# Patient Record
Sex: Male | Born: 1960
Health system: Southern US, Community
[De-identification: ages and names within clinical notes are randomized; demographics above are authoritative.]

## PROBLEM LIST (undated history)

## (undated) DIAGNOSIS — R7303 Prediabetes: Secondary | ICD-10-CM

## (undated) DIAGNOSIS — E669 Obesity, unspecified: Secondary | ICD-10-CM

## (undated) DIAGNOSIS — L0390 Cellulitis, unspecified: Secondary | ICD-10-CM

---

## 2004-06-05 ENCOUNTER — Emergency Department (HOSPITAL_COMMUNITY): Admission: EM | Admit: 2004-06-05 | Discharge: 2004-06-05 | Payer: Self-pay | Admitting: Emergency Medicine

## 2005-08-02 ENCOUNTER — Emergency Department (HOSPITAL_COMMUNITY): Admission: EM | Admit: 2005-08-02 | Discharge: 2005-08-02 | Payer: Self-pay | Admitting: *Deleted

## 2008-04-18 ENCOUNTER — Emergency Department (HOSPITAL_COMMUNITY): Admission: EM | Admit: 2008-04-18 | Discharge: 2008-04-18 | Payer: Self-pay | Admitting: Emergency Medicine

## 2014-04-24 ENCOUNTER — Emergency Department (HOSPITAL_COMMUNITY)
Admission: EM | Admit: 2014-04-24 | Discharge: 2014-04-24 | Disposition: A | Discharge status: Home / Self Care | Payer: No Typology Code available for payment source | Attending: Emergency Medicine | Admitting: Emergency Medicine

## 2014-04-24 ENCOUNTER — Emergency Department (HOSPITAL_COMMUNITY): Payer: No Typology Code available for payment source

## 2014-04-24 ENCOUNTER — Encounter (HOSPITAL_COMMUNITY): Payer: Self-pay | Admitting: Emergency Medicine

## 2014-04-24 DIAGNOSIS — S0993XA Unspecified injury of face, initial encounter: Secondary | ICD-10-CM | POA: Insufficient documentation

## 2014-04-24 DIAGNOSIS — S3981XA Other specified injuries of abdomen, initial encounter: Secondary | ICD-10-CM | POA: Insufficient documentation

## 2014-04-24 DIAGNOSIS — Y9389 Activity, other specified: Secondary | ICD-10-CM | POA: Insufficient documentation

## 2014-04-24 DIAGNOSIS — S4980XA Other specified injuries of shoulder and upper arm, unspecified arm, initial encounter: Secondary | ICD-10-CM | POA: Insufficient documentation

## 2014-04-24 DIAGNOSIS — S46909A Unspecified injury of unspecified muscle, fascia and tendon at shoulder and upper arm level, unspecified arm, initial encounter: Secondary | ICD-10-CM | POA: Insufficient documentation

## 2014-04-24 DIAGNOSIS — Y92410 Unspecified street and highway as the place of occurrence of the external cause: Secondary | ICD-10-CM | POA: Insufficient documentation

## 2014-04-24 DIAGNOSIS — S199XXA Unspecified injury of neck, initial encounter: Principal | ICD-10-CM

## 2014-04-24 DIAGNOSIS — Z79899 Other long term (current) drug therapy: Secondary | ICD-10-CM | POA: Insufficient documentation

## 2014-04-24 MED ORDER — HYDROCODONE-ACETAMINOPHEN 5-325 MG PO TABS: 2.0000 | ORAL_TABLET | Freq: Four times a day (QID) | ORAL | Status: DC | PRN

## 2014-04-24 MED ORDER — CYCLOBENZAPRINE HCL 10 MG PO TABS
5.0000 mg | ORAL_TABLET | Freq: Once | ORAL | Status: AC
  Administered 2014-04-24: 5 mg via ORAL
  Filled 2014-04-24: qty 1

## 2014-04-24 MED ORDER — CYCLOBENZAPRINE HCL 5 MG PO TABS: 5.0000 mg | ORAL_TABLET | Freq: Three times a day (TID) | ORAL | Status: DC | PRN

## 2014-04-24 MED ORDER — HYDROCODONE-ACETAMINOPHEN 5-325 MG PO TABS
2.0000 | ORAL_TABLET | Freq: Once | ORAL | Status: AC
  Administered 2014-04-24: 2 via ORAL
  Filled 2014-04-24: qty 2
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
## 2014-04-24 NOTE — ED Notes (Signed)
No visible injuries or brusing noted. Denies htiting his head, denies LOC.
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
## 2015-03-06 ENCOUNTER — Emergency Department (HOSPITAL_COMMUNITY)
Admission: EM | Admit: 2015-03-06 | Discharge: 2015-03-06 | Disposition: A | Discharge status: Home / Self Care | Payer: Non-veteran care | Attending: Emergency Medicine | Admitting: Emergency Medicine

## 2015-03-06 ENCOUNTER — Encounter (HOSPITAL_COMMUNITY): Payer: Self-pay | Admitting: *Deleted

## 2015-03-06 DIAGNOSIS — K088 Other specified disorders of teeth and supporting structures: Secondary | ICD-10-CM | POA: Diagnosis present

## 2015-03-06 DIAGNOSIS — E669 Obesity, unspecified: Secondary | ICD-10-CM | POA: Diagnosis not present

## 2015-03-06 DIAGNOSIS — A691 Other Vincent's infections: Secondary | ICD-10-CM

## 2015-03-06 HISTORY — DX: Obesity, unspecified: E66.9

## 2015-03-06 MED ORDER — CHLORHEXIDINE GLUCONATE 0.12 % MT SOLN: 15.0000 mL | Freq: Two times a day (BID) | OROMUCOSAL | Status: DC

## 2015-03-06 MED ORDER — PREDNISONE 20 MG PO TABS: 20.0000 mg | ORAL_TABLET | Freq: Two times a day (BID) | ORAL | Status: DC

## 2015-03-06 MED ORDER — PENICILLIN V POTASSIUM 250 MG PO TABS
500.0000 mg | ORAL_TABLET | Freq: Once | ORAL | Status: AC
Start: 2015-03-06 — End: 2015-03-06
  Administered 2015-03-06: 500 mg via ORAL
  Filled 2015-03-06: qty 2

## 2015-03-06 MED ORDER — PREDNISONE 20 MG PO TABS
60.0000 mg | ORAL_TABLET | Freq: Once | ORAL | Status: AC
  Administered 2015-03-06: 60 mg via ORAL
  Filled 2015-03-06: qty 3

## 2015-03-06 MED ORDER — HYDROCODONE-ACETAMINOPHEN 5-325 MG PO TABS: 1.0000 | ORAL_TABLET | ORAL | Status: DC | PRN

## 2015-03-06 MED ORDER — PENICILLIN V POTASSIUM 500 MG PO TABS: 500.0000 mg | ORAL_TABLET | Freq: Four times a day (QID) | ORAL | Status: DC

## 2015-03-06 MED ORDER — HYDROCODONE-ACETAMINOPHEN 5-325 MG PO TABS
1.0000 | ORAL_TABLET | Freq: Once | ORAL | Status: AC
  Administered 2015-03-06: 1 via ORAL
  Filled 2015-03-06: qty 1

## 2015-03-06 NOTE — ED Provider Notes (Signed)
CSN: [id number]     Arrival date & time 03/06/15  1324 History   First MD Initiated Contact with Patient 03/06/15 1604     Chief Complaint  Patient presents with  . Dental Pain  . Facial Swelling      HPI  Patient presents for valuation of mouth pain. He states the last 3 days he's had pain around his left maxillary central incisor. He noticed that his mouth would believe a brush his teeth. His lipids become swollen. He presents here.  Past Medical History  Diagnosis Date  . Obesity    History reviewed. No pertinent past surgical history. History reviewed. No pertinent family history. History  Substance Use Topics  . Smoking status: Never Smoker   . Smokeless tobacco: Not on file  . Alcohol Use: No    Review of Systems  Constitutional: Negative for fever, chills, diaphoresis, appetite change and fatigue.  HENT: Positive for mouth sores. Negative for sore throat and trouble swallowing.   Eyes: Negative for visual disturbance.  Respiratory: Negative for cough, chest tightness, shortness of breath and wheezing.   Cardiovascular: Negative for chest pain.  Gastrointestinal: Negative for nausea, vomiting, abdominal pain, diarrhea and abdominal distention.  Endocrine: Negative for polydipsia, polyphagia and polyuria.  Genitourinary: Negative for dysuria, frequency and hematuria.  Musculoskeletal: Negative for gait problem.  Skin: Negative for color change, pallor and rash.  Neurological: Negative for dizziness, syncope, light-headedness and headaches.  Hematological: Does not bruise/bleed easily.  Psychiatric/Behavioral: Negative for behavioral problems and confusion.      Allergies  Review of patient's allergies indicates no known allergies.  Home Medications   Prior to Admission medications   Medication Sig Start Date End Date Taking? Authorizing Provider  chlorhexidine (PERIDEX) 0.12 % solution Use as directed 15 mLs in the mouth or throat 2 (two) times daily. 03/06/15    Rolland Porter, MD  cyclobenzaprine (FLEXERIL) 5 MG tablet Take 1 tablet (5 mg total) by mouth 3 (three) times daily as needed for muscle spasms. Patient not taking: Reported on 03/06/2015 04/24/14   Marily Memos, MD  HYDROcodone-acetaminophen (NORCO/VICODIN) 5-325 MG per tablet Take 1 tablet by mouth every 4 (four) hours as needed. 03/06/15   Rolland Porter, MD  penicillin v potassium (VEETID) 500 MG tablet Take 1 tablet (500 mg total) by mouth 4 (four) times daily. 03/06/15   Rolland Porter, MD  predniSONE (DELTASONE) 20 MG tablet Take 1 tablet (20 mg total) by mouth 2 (two) times daily with a meal. 03/06/15   Rolland Porter, MD   BP 132/64 mmHg  Pulse 87  Temp(Src) 98.3 F (36.8 C)  Resp 16  Ht  (1.753 m)  Wt 430 lb (195.047 kg)  BMI 63.47 kg/m2  SpO2 96% Physical Exam  Constitutional: He is oriented to person, place, and time. He appears well-developed and well-nourished. No distress.  HENT:  Head: Normocephalic.  Mouth/Throat:    Erythema of the gingiva with overlying gray stand membrane from the midline to the left along the buccal surface of the maxillary gingiva. A few accompanying ulcerations. Medial surface of the gingiva and soft palate appear normal. Bimanual exam of the lip show no fluctuance suggest abscess.  Eyes: Conjunctivae are normal. Pupils are equal, round, and reactive to light. No scleral icterus.  Neck: Normal range of motion. Neck supple. No thyromegaly present.  Cardiovascular: Normal rate and regular rhythm.  Exam reveals no gallop and no friction rub.   No murmur heard. Pulmonary/Chest: Effort normal and breath  sounds normal. No respiratory distress. He has no wheezes. He has no rales.  Abdominal: Soft. Bowel sounds are normal. He exhibits no distension. There is no tenderness. There is no rebound.  Musculoskeletal: Normal range of motion.  Neurological: He is alert and oriented to person, place, and time.  Skin: Skin is warm and dry. No rash noted.  Psychiatric: He has  a normal mood and affect. His behavior is normal.    ED Course  Procedures (including critical care time) Labs Review Labs Reviewed - No data to display  Imaging Review No results found.   EKG Interpretation None      MDM   Final diagnoses:  Acute necrotizing ulcerative gingivitis    Exam does not suggest abscess.  Initial overlying the gingiva. Exam consistent with acute necrotizing ulcerative gingivostomatitis/trench mouth. Plan. Extremities. Prednisone, penicillin, pain medication. Recheck if not improving.    Rolland PorterMark Dawn Convery, MD 03/06/15 1626

## 2015-03-06 NOTE — ED Notes (Signed)
Pt reports recent dental pain to front tooth. Now having swelling to lips and nose. Denies taking any prescription medications. Airway intact at this time.

## 2015-03-06 NOTE — Discharge Instructions (Signed)
Gingivitis  Gingivitis is an infection of the teeth and bones that support the teeth. Your gums become red, sore, and puffy (swollen). It is caused by germs that build up on your teeth and gums (plaque). HOME CARE  Floss and then brush your teeth.  Brush at least twice a day.  Floss at least once a day.  Avoid sugar between meals.  Do not drink juice before bed. Only drink water.  Make and keep your regular checkups and cleanings with your dentist.  Use any mouth care product or toothpaste as told by your dentist. GET HELP RIGHT AWAY IF:  You have painful, red tissue around your teeth.  You have trouble chewing.  You have loose or infected teeth. MAKE SURE YOU:  Understand these instructions.  Will watch your condition.  Will get help right away if you are not doing well or get worse. Document Released: 12/01/2010 Document Revised: 01/21/2012 Document Reviewed: 02/02/2011 [facility] Patient Information 2015 [place], [place]. This information is not intended to replace advice given to you by your health care provider. Make sure you discuss any questions you have with your health care provider.  Trench Mouth Trench mouth is a sudden, painful sore (ulceration) of the gums that is caused by bacteria. Trench mouth is a painful form of gingivitis. Gingivitis means redness and soreness of the gums.. The mouth normally contains a balance of all the germs growing in it. When the balance is upset and too many germs start growing, it can cause painful ulcers on the gums. It is an uncommon disorder which usually affects young adults. CAUSES   Poor nutrition.  Poor oral hygiene.  Smoking.  Emotional distress. SYMPTOMS   Painful gums which are red, swollen, and covered with a grayish film. There may be destruction of tissue between the teeth due to the ulcerations.  Gum bleeding with even minor brushing or irritation.  Ulcerations of the gums along with a bad taste in the mouth and  bad breath.  Fever and swollen glands in the neck. DIAGNOSIS  Your caregiver can usually make this diagnosis by a physical exam. Sometimes, X-rays and cultures may be used to help with the diagnosis. TREATMENT  Treatment is aimed at relief of symptoms and getting rid of the infection. Antibiotic medicine may be prescribed. HOME CARE INSTRUCTIONS  Good oral hygiene is necessary. Thorough toothbrushing and flossing must be performed often. This should be done after meals and at bedtime. Your caregiver may be able to help by suggesting a soothing rinse or the use of a local pain medicine, such as viscous lidocaine, before brushing.  Warm salt water rinses made up of 1 tsp of salt in 2 cups of warm water may be helpful. If the rinse is painful, add more water.  Only take over-the-counter or prescription medicines for pain, discomfort, or fever as directed by your caregiver.  Take your antibiotics as directed. Finish them even if you start to feel better.  Follow your dentist's instructions for teeth cleaning during this infection. Follow up with all your caregivers as directed.  Avoid smoking, alcohol, and irritating foods. You will know immediately which foods irritate your mouth. Typically, these will be spicy, sour, or citrus foods. SEEK IMMEDIATE MEDICAL CARE IF:   You develop pain or swelling in your face.  You have a fever.  You are unable to take fluids or eat food comfortably.  Your medicines do not seem to be working and you feel you are getting worse.  Your medicines are not relieving your pain.  You develop a stiff neck or a severe headache, which is unrelieved with medicines. MAKE SURE YOU:  Understand these instructions.  Will watch your condition.  Will get help right away if you are not doing well or get worse. Document Released: 02/01/2005 Document Revised: 01/21/2012 Document Reviewed: 06/07/2011 [facility] Patient Information 2015 [place]. This information  is not intended to replace advice given to you by your health care provider. Make sure you discuss any questions you have with your health care provider.

## 2016-10-18 ENCOUNTER — Encounter (HOSPITAL_COMMUNITY): Payer: Self-pay | Admitting: *Deleted

## 2016-10-18 ENCOUNTER — Emergency Department (HOSPITAL_COMMUNITY)
Admission: EM | Admit: 2016-10-18 | Discharge: 2016-10-18 | Disposition: A | Discharge status: Home / Self Care | Payer: Non-veteran care | Attending: Emergency Medicine | Admitting: Emergency Medicine

## 2016-10-18 ENCOUNTER — Emergency Department (HOSPITAL_COMMUNITY): Payer: Non-veteran care

## 2016-10-18 DIAGNOSIS — S3992XA Unspecified injury of lower back, initial encounter: Secondary | ICD-10-CM | POA: Insufficient documentation

## 2016-10-18 DIAGNOSIS — Y939 Activity, unspecified: Secondary | ICD-10-CM | POA: Diagnosis not present

## 2016-10-18 DIAGNOSIS — M79621 Pain in right upper arm: Secondary | ICD-10-CM | POA: Diagnosis not present

## 2016-10-18 DIAGNOSIS — Y92410 Unspecified street and highway as the place of occurrence of the external cause: Secondary | ICD-10-CM | POA: Diagnosis not present

## 2016-10-18 DIAGNOSIS — Y999 Unspecified external cause status: Secondary | ICD-10-CM | POA: Insufficient documentation

## 2016-10-18 MED ORDER — TRAMADOL HCL 50 MG PO TABS: 50.0000 mg | ORAL_TABLET | Freq: Four times a day (QID) | ORAL | 0 refills | Status: DC | PRN

## 2016-10-18 MED ORDER — IBUPROFEN 200 MG PO TABS
600.0000 mg | ORAL_TABLET | Freq: Once | ORAL | Status: AC
  Administered 2016-10-18: 600 mg via ORAL
  Filled 2016-10-18: qty 1

## 2016-10-18 MED ORDER — IBUPROFEN 600 MG PO TABS: 600.0000 mg | ORAL_TABLET | Freq: Four times a day (QID) | ORAL | 0 refills | Status: DC | PRN

## 2016-10-18 MED ORDER — METHOCARBAMOL 500 MG PO TABS: 500.0000 mg | ORAL_TABLET | Freq: Two times a day (BID) | ORAL | 0 refills | Status: DC
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
## 2016-10-18 NOTE — ED Notes (Signed)
Taken to xray at this time. 
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
## 2016-10-18 NOTE — Discharge Instructions (Signed)
Take your medications as prescribed. I also recommend applying ice and/or heat to affected area for 15-20 minutes 3-4 times daily for additional pain relief. Refrain from doing any heavy lifting, squatting or repetitive movements that exacerbate your symptoms. Please follow up with a primary care provider from the Resource Guide provided below in one week if her symptoms have not improved. Please return to the Emergency Department if symptoms worsen or new onset of fever, numbness, tingling, groin anesthesia, loss of bowel or bladder, weakness.

## 2016-12-26 ENCOUNTER — Encounter (HOSPITAL_COMMUNITY): Payer: Self-pay

## 2016-12-26 ENCOUNTER — Emergency Department (HOSPITAL_COMMUNITY)
Admission: EM | Admit: 2016-12-26 | Discharge: 2016-12-26 | Disposition: A | Discharge status: Home / Self Care | Payer: Non-veteran care | Attending: Emergency Medicine | Admitting: Emergency Medicine

## 2016-12-26 DIAGNOSIS — Z5321 Procedure and treatment not carried out due to patient leaving prior to being seen by health care provider: Secondary | ICD-10-CM | POA: Diagnosis not present

## 2016-12-26 DIAGNOSIS — R1031 Right lower quadrant pain: Secondary | ICD-10-CM | POA: Diagnosis not present

## 2016-12-26 LAB — COMPREHENSIVE METABOLIC PANEL
ALT: 17 U/L (ref 17–63)
AST: 23 U/L (ref 15–41)
Albumin: 3.9 g/dL (ref 3.5–5.0)
Alkaline Phosphatase: 65 U/L (ref 38–126)
Anion gap: 12 (ref 5–15)
BUN: 11 mg/dL (ref 6–20)
CHLORIDE: 100 mmol/L — AB (ref 101–111)
CO2: 26 mmol/L (ref 22–32)
CREATININE: 1.02 mg/dL (ref 0.61–1.24)
Calcium: 9.6 mg/dL (ref 8.9–10.3)
Glucose, Bld: 119 mg/dL — ABNORMAL HIGH (ref 65–99)
Potassium: 4.2 mmol/L (ref 3.5–5.1)
Sodium: 138 mmol/L (ref 135–145)
Total Bilirubin: 0.5 mg/dL (ref 0.3–1.2)
Total Protein: 7.5 g/dL (ref 6.5–8.1)

## 2016-12-26 LAB — CBC
HCT: 42.4 % (ref 39.0–52.0)
Hemoglobin: 13.7 g/dL (ref 13.0–17.0)
MCH: 28.2 pg (ref 26.0–34.0)
MCHC: 32.3 g/dL (ref 30.0–36.0)
MCV: 87.4 fL (ref 78.0–100.0)
PLATELETS: 250 10*3/uL (ref 150–400)
RBC: 4.85 MIL/uL (ref 4.22–5.81)
RDW: 15.6 % — ABNORMAL HIGH (ref 11.5–15.5)
WBC: 10.6 10*3/uL — AB (ref 4.0–10.5)

## 2016-12-26 LAB — LIPASE, BLOOD: LIPASE: 10 U/L — AB (ref 11–51)

## 2016-12-26 NOTE — ED Provider Notes (Signed)
Patient not in room   Ronald Grizzleanielle Hannah Strader, MD 12/26/16 [phone]

## 2016-12-26 NOTE — ED Triage Notes (Signed)
Pt reports RLQ abd pain he describes as a pulling sensation. He reports he can feel a knot in the area. Pt denies nausea or vomiting.

## 2016-12-26 NOTE — ED Notes (Signed)
Pt's name called for room assignment.  No answer. 

## 2016-12-26 NOTE — ED Notes (Signed)
Name called for room assignment - no answer 

## 2016-12-26 NOTE — ED Notes (Signed)
Pt could not void at this time 

## 2017-05-02 ENCOUNTER — Emergency Department (HOSPITAL_COMMUNITY)
Admission: EM | Admit: 2017-05-02 | Discharge: 2017-05-02 | Disposition: A | Discharge status: Home / Self Care | Payer: Non-veteran care | Attending: Emergency Medicine | Admitting: Emergency Medicine

## 2017-05-02 ENCOUNTER — Encounter (HOSPITAL_COMMUNITY): Payer: Self-pay | Admitting: Emergency Medicine

## 2017-05-02 DIAGNOSIS — L03115 Cellulitis of right lower limb: Secondary | ICD-10-CM

## 2017-05-02 DIAGNOSIS — M79604 Pain in right leg: Secondary | ICD-10-CM | POA: Diagnosis present

## 2017-05-02 LAB — BASIC METABOLIC PANEL
ANION GAP: 8 (ref 5–15)
BUN: 9 mg/dL (ref 6–20)
CHLORIDE: 98 mmol/L — AB (ref 101–111)
CO2: 27 mmol/L (ref 22–32)
Calcium: 8.6 mg/dL — ABNORMAL LOW (ref 8.9–10.3)
Creatinine, Ser: 1.04 mg/dL (ref 0.61–1.24)
GFR calc Af Amer: >60 mL/min (ref >60)
GFR calc non Af Amer: >60 mL/min (ref >60)
GLUCOSE: 116 mg/dL — AB (ref 65–99)
POTASSIUM: 3.6 mmol/L (ref 3.5–5.1)
Sodium: 133 mmol/L — ABNORMAL LOW (ref 135–145)

## 2017-05-02 LAB — CBC WITH DIFFERENTIAL/PLATELET
BASOS ABS: 0 10*3/uL (ref 0.0–0.1)
Basophils Relative: 0 %
Eosinophils Absolute: 0.1 10*3/uL (ref 0.0–0.7)
Eosinophils Relative: 1 %
HEMATOCRIT: 38.9 % — AB (ref 39.0–52.0)
Hemoglobin: 12.8 g/dL — ABNORMAL LOW (ref 13.0–17.0)
Lymphocytes Relative: 28 %
Lymphs Abs: 3.5 10*3/uL (ref 0.7–4.0)
MCH: 28.9 pg (ref 26.0–34.0)
MCHC: 32.9 g/dL (ref 30.0–36.0)
MCV: 87.8 fL (ref 78.0–100.0)
MONO ABS: 1.3 10*3/uL — AB (ref 0.1–1.0)
Monocytes Relative: 11 %
NEUTROS ABS: 7.6 10*3/uL (ref 1.7–7.7)
Neutrophils Relative %: 60 %
Platelets: 198 10*3/uL (ref 150–400)
RBC: 4.43 MIL/uL (ref 4.22–5.81)
RDW: 15.9 % — ABNORMAL HIGH (ref 11.5–15.5)
WBC: 12.5 10*3/uL — AB (ref 4.0–10.5)

## 2017-05-02 MED ORDER — SULFAMETHOXAZOLE-TRIMETHOPRIM 800-160 MG PO TABS: 1.0000 | ORAL_TABLET | Freq: Two times a day (BID) | ORAL | 0 refills | Status: AC

## 2017-05-02 MED ORDER — SULFAMETHOXAZOLE-TRIMETHOPRIM 800-160 MG PO TABS
1.0000 | ORAL_TABLET | Freq: Once | ORAL | Status: AC
  Administered 2017-05-02: 1 via ORAL
  Filled 2017-05-02: qty 1

## 2017-05-02 MED ORDER — ONDANSETRON 4 MG PO TBDP: 4.0000 mg | ORAL_TABLET | Freq: Three times a day (TID) | ORAL | 0 refills | Status: DC | PRN

## 2017-05-02 MED ORDER — TRAMADOL HCL 50 MG PO TABS: 50.0000 mg | ORAL_TABLET | Freq: Four times a day (QID) | ORAL | 0 refills | Status: DC | PRN

## 2017-05-02 MED ORDER — CEPHALEXIN 500 MG PO CAPS: 500.0000 mg | ORAL_CAPSULE | Freq: Four times a day (QID) | ORAL | 0 refills | Status: DC

## 2017-05-02 MED ORDER — CEPHALEXIN 250 MG PO CAPS
500.0000 mg | ORAL_CAPSULE | Freq: Once | ORAL | Status: AC
Start: 2017-05-02 — End: 2017-05-02
  Administered 2017-05-02: 500 mg via ORAL
  Filled 2017-05-02: qty 2

## 2017-05-02 NOTE — ED Provider Notes (Signed)
TIME SEEN: 5:18 AM  CHIEF COMPLAINT: Right leg pain and swelling  HPI: Patient is a 56 year old male with history of obesity who presents to the emergency department with right leg pain in the lower portion of the leg and swelling. States he thinks he may have been bitten by an insect several days ago and has not had redness and swelling to the distal portion of the right leg. No Swelling or calf tenderness. No history of DVT. Reports he has had associated chills, decreased appetite, diarrhea. No nausea or vomiting. He is not a diabetic or immunocompromised. No numbness or tingling. No focal weakness. Pain is worse with palpation and with walking but he is able to ambulate. He denies any injury to this area. He states that he felt like something may have stung him but did not see any insect.  ROS: See HPI Constitutional: no fever  Eyes: no drainage  ENT: no runny nose   Cardiovascular:  no chest pain  Resp: no SOB  GI: no vomiting GU: no dysuria Integumentary: no rash  Allergy: no hives  Musculoskeletal: no leg swelling  Neurological: no slurred speech ROS otherwise negative  PAST MEDICAL HISTORY/PAST SURGICAL HISTORY:  Past Medical History:  Diagnosis Date  . Obesity     MEDICATIONS:  Prior to Admission medications   Medication Sig Start Date End Date Taking? Authorizing Provider  chlorhexidine (PERIDEX) 0.12 % solution Use as directed 15 mLs in the mouth or throat 2 (two) times daily. Patient not taking: Reported on 12/26/2016 03/06/15   Rolland Porter, MD  cyclobenzaprine (FLEXERIL) 5 MG tablet Take 1 tablet (5 mg total) by mouth 3 (three) times daily as needed for muscle spasms. Patient not taking: Reported on 03/06/2015 04/24/14   Mesner, Barbara Cower, MD  HYDROcodone-acetaminophen (NORCO/VICODIN) 5-325 MG per tablet Take 1 tablet by mouth every 4 (four) hours as needed. Patient not taking: Reported on 12/26/2016 03/06/15   Rolland Porter, MD  ibuprofen (ADVIL,MOTRIN) 600 MG tablet Take 1 tablet  (600 mg total) by mouth every 6 (six) hours as needed. Patient not taking: Reported on 12/26/2016 10/18/16   Barrett Henle, PA-C  methocarbamol (ROBAXIN) 500 MG tablet Take 1 tablet (500 mg total) by mouth 2 (two) times daily. Patient not taking: Reported on 12/26/2016 10/18/16   Barrett Henle, PA-C  traMADol (ULTRAM) 50 MG tablet Take 1 tablet (50 mg total) by mouth every 6 (six) hours as needed. Patient not taking: Reported on 12/26/2016 10/18/16   Barrett Henle, PA-C    ALLERGIES:  No Known Allergies  SOCIAL HISTORY:  Social History  Substance Use Topics  . Smoking status: Never Smoker  . Smokeless tobacco: Never Used  . Alcohol use No    FAMILY HISTORY: No family history on file.  EXAM: BP 131/74   Pulse 75   Temp 98.5 F (36.9 C) (Oral)   Resp (!) 21   Ht 5\' 11"  (1.803 m)   Wt (!) 187.8 kg (414 lb)   SpO2 97%   BMI 57.74 kg/m  CONSTITUTIONAL: Alert and oriented and responds appropriately to questions. Well-appearing; well-nourished, obese, nontoxic, afebrile, no distress HEAD: Normocephalic EYES: Conjunctivae clear, pupils appear equal, EOMI ENT: normal nose; moist mucous membranes NECK: Supple, no meningismus, no nuchal rigidity, no LAD  CARD: RRR; S1 and S2 appreciated; no murmurs, no clicks, no rubs, no gallops RESP: Normal chest excursion without splinting or tachypnea; breath sounds clear and equal bilaterally; no wheezes, no rhonchi, no rales, no hypoxia or  respiratory distress, speaking full sentences ABD/GI: Normal bowel sounds; non-distended; soft, non-tender, no rebound, no guarding, no peritoneal signs, no hepatosplenomegaly BACK:  The back appears normal and is non-tender to palpation, there is no CVA tenderness EXT: Normal ROM in all joints; non-tender to palpation; no edema; normal capillary refill; no cyanosis, no calf tenderness or swelling, no joint effusion, compartments are all soft, 2+ DP pulses bilaterally, normal  sensation in both legs, legs are warm and well-perfused, no bony tenderness or bony deformity    SKIN: Normal color for age and race; warm; no rash, patient has erythema and warmth noted to the distal right lower extremity that is almost circumferential just proximal to the ankle with some soft tissue swelling over the lateral malleolus NEURO: Moves all extremities equally PSYCH: The patient's mood and manner are appropriate. Grooming and personal hygiene are appropriate.  MEDICAL DECISION MAKING: Patient here with what appears to be a cellulitis to the distal right lower extremity. No calf tenderness or swelling. He has no risk factors for DVT at this time. His anorexia, diarrhea and chills. He is afebrile and nontoxic appearing. We'll obtain labs but suspect that patient can be discharged home with oral antibiotics. We'll give Bactrim and Keflex. There is no sign of abscess for drainage. No sign of septic arthritis, gout. He is neurovascularly intact distally. No history of injury to suggest fracture.  ED PROGRESS: Labs show mild leukocytosis without left shift. Otherwise unremarkable. Again he does not appear septic and is afebrile. Nontoxic and in no distress. He is able to ambulate. We'll discharge with Keflex and Bactrim for the next week. We'll discharge with instructions to alternate Tylenol and Motrin. Also give him tramadol and Zofran to take as needed. Recommended Imodium over-the-counter as needed for diarrhea. Discussed return precautions. Patient is comfortable with this plan.   At this time, I do not feel there is any life-threatening condition present. I have reviewed and discussed all results (EKG, imaging, lab, urine as appropriate) and exam findings with patient/family. I have reviewed nursing notes and appropriate previous records.  I feel the patient is safe to be discharged home without further emergent workup and can continue workup as an outpatient as needed. Discussed usual and  customary return precautions. Patient/family verbalize understanding and are comfortable with this plan.  Outpatient follow-up has been provided if needed. All questions have been answered.      Ronald Robles, Ronald MawKristen N, DO 05/02/17 [phone]

## 2017-05-02 NOTE — ED Triage Notes (Signed)
Pt c/o of right lower leg pain and swelling. Also c/o of diarrhea and not being able to eat for 2 days denies vomiting but endorses nausea

## 2017-05-02 NOTE — Discharge Instructions (Signed)
You may alternate Tylenol 1000 mg every 6 hours as needed for pain and Ibuprofen 800 mg every 8 hours as needed for pain.  Please take Ibuprofen with food. ° ° ° °To find a primary care or specialty doctor please call [phone] or [phone] to access "[facility] Find a Doctor Service." ° °You may also go on the [facility] website at [email or web address] ° °There are also multiple [facility], [facility], [facility] and [facility] practices throughout the [place] that are frequently accepting new patients. You may find a clinic that is close to your home and contact them. ° °[facility] -  °[address] °[place] [address] °[phone] ° ° °[facility] -  °[address] °[place] [address] [phone] ° ° °[facility] - °[address]  °[place] [address] °[phone] ° ° °

## 2017-05-03 MED FILL — ONDANSETRON ODT 4 MG TABLET: 4 | 7 days supply | Qty: 20 | Fill #0

## 2017-05-03 MED FILL — traMADol HCL 50 MG TABS: 50 | 4 days supply | Qty: 15 | Fill #0

## 2017-05-03 MED FILL — SULFAMETHOXAZOLE/TMP DS TAB: 800-160 | 7 days supply | Qty: 14 | Fill #0

## 2017-05-03 MED FILL — CEPHALEXIN 500 MG CAPSULE: 500 | 7 days supply | Qty: 28 | Fill #0

## 2017-11-09 ENCOUNTER — Emergency Department (HOSPITAL_COMMUNITY)
Admission: EM | Admit: 2017-11-09 | Discharge: 2017-11-09 | Disposition: A | Discharge status: Home / Self Care | Payer: Non-veteran care | Attending: Emergency Medicine | Admitting: Emergency Medicine

## 2017-11-09 ENCOUNTER — Encounter (HOSPITAL_COMMUNITY): Payer: Self-pay | Admitting: Nurse Practitioner

## 2017-11-09 DIAGNOSIS — Z5321 Procedure and treatment not carried out due to patient leaving prior to being seen by health care provider: Secondary | ICD-10-CM | POA: Insufficient documentation

## 2017-11-09 DIAGNOSIS — M79604 Pain in right leg: Secondary | ICD-10-CM | POA: Insufficient documentation

## 2017-11-09 LAB — COMPREHENSIVE METABOLIC PANEL
ALT: 33 U/L (ref 17–63)
ANION GAP: 8 (ref 5–15)
AST: 27 U/L (ref 15–41)
Albumin: 3.5 g/dL (ref 3.5–5.0)
Alkaline Phosphatase: 68 U/L (ref 38–126)
BILIRUBIN TOTAL: 0.6 mg/dL (ref 0.3–1.2)
BUN: 13 mg/dL (ref 6–20)
CHLORIDE: 100 mmol/L — AB (ref 101–111)
CO2: 28 mmol/L (ref 22–32)
Calcium: 9.2 mg/dL (ref 8.9–10.3)
Creatinine, Ser: 0.88 mg/dL (ref 0.61–1.24)
Glucose, Bld: 116 mg/dL — ABNORMAL HIGH (ref 65–99)
POTASSIUM: 3.9 mmol/L (ref 3.5–5.1)
Sodium: 136 mmol/L (ref 135–145)
TOTAL PROTEIN: 7.7 g/dL (ref 6.5–8.1)

## 2017-11-09 LAB — CBC WITH DIFFERENTIAL/PLATELET
Basophils Absolute: 0 10*3/uL (ref 0.0–0.1)
Basophils Relative: 0 %
EOS PCT: 1 %
Eosinophils Absolute: 0.1 10*3/uL (ref 0.0–0.7)
HCT: 38.4 % — ABNORMAL LOW (ref 39.0–52.0)
HEMOGLOBIN: 12.5 g/dL — AB (ref 13.0–17.0)
LYMPHS ABS: 3.4 10*3/uL (ref 0.7–4.0)
LYMPHS PCT: 35 %
MCH: 28.5 pg (ref 26.0–34.0)
MCHC: 32.6 g/dL (ref 30.0–36.0)
MCV: 87.5 fL (ref 78.0–100.0)
Monocytes Absolute: 1 10*3/uL (ref 0.1–1.0)
Monocytes Relative: 10 %
NEUTROS ABS: 5.3 10*3/uL (ref 1.7–7.7)
NEUTROS PCT: 54 %
PLATELETS: 282 10*3/uL (ref 150–400)
RBC: 4.39 MIL/uL (ref 4.22–5.81)
RDW: 15.1 % (ref 11.5–15.5)
WBC: 9.8 10*3/uL (ref 4.0–10.5)

## 2017-11-09 LAB — I-STAT CG4 LACTIC ACID, ED: LACTIC ACID, VENOUS: 0.55 mmol/L (ref 0.5–1.9)

## 2017-11-09 NOTE — ED Notes (Signed)
Called  No response from lobby 

## 2017-11-09 NOTE — ED Notes (Signed)
Called  No answer from lobby 

## 2017-11-09 NOTE — ED Triage Notes (Signed)
Pt presents with right leg pain, discoloration, swelling/infllamation that he reports is consistent with last time he was diagnosed with cellulitis of lower extremities. Adds that he was rx'd abx that helped clear it up. Notes a marked decrease in appetite but denies N/V/D.

## 2017-11-14 ENCOUNTER — Other Ambulatory Visit: Payer: Self-pay

## 2017-11-14 ENCOUNTER — Encounter (HOSPITAL_COMMUNITY): Payer: Self-pay | Admitting: Emergency Medicine

## 2017-11-14 ENCOUNTER — Emergency Department (HOSPITAL_COMMUNITY)
Admission: EM | Admit: 2017-11-14 | Discharge: 2017-11-14 | Disposition: A | Discharge status: Home / Self Care | Payer: Non-veteran care | Attending: Emergency Medicine | Admitting: Emergency Medicine

## 2017-11-14 ENCOUNTER — Emergency Department (HOSPITAL_BASED_OUTPATIENT_CLINIC_OR_DEPARTMENT_OTHER)
Admit: 2017-11-14 | Discharge: 2017-11-14 | Disposition: A | Discharge status: Home / Self Care | Payer: Non-veteran care

## 2017-11-14 DIAGNOSIS — R2240 Localized swelling, mass and lump, unspecified lower limb: Secondary | ICD-10-CM | POA: Diagnosis not present

## 2017-11-14 DIAGNOSIS — M7989 Other specified soft tissue disorders: Secondary | ICD-10-CM

## 2017-11-14 DIAGNOSIS — Z5321 Procedure and treatment not carried out due to patient leaving prior to being seen by health care provider: Secondary | ICD-10-CM | POA: Diagnosis not present

## 2017-11-14 LAB — CBC WITH DIFFERENTIAL/PLATELET
Basophils Absolute: 0 10*3/uL (ref 0.0–0.1)
Basophils Relative: 0 %
Eosinophils Absolute: 0.2 10*3/uL (ref 0.0–0.7)
Eosinophils Relative: 2 %
HCT: 38.7 % — ABNORMAL LOW (ref 39.0–52.0)
Hemoglobin: 12.3 g/dL — ABNORMAL LOW (ref 13.0–17.0)
Lymphocytes Relative: 40 %
Lymphs Abs: 4.6 10*3/uL — ABNORMAL HIGH (ref 0.7–4.0)
MCH: 28.5 pg (ref 26.0–34.0)
MCHC: 31.8 g/dL (ref 30.0–36.0)
MCV: 89.6 fL (ref 78.0–100.0)
Monocytes Absolute: 0.9 10*3/uL (ref 0.1–1.0)
Monocytes Relative: 8 %
Neutro Abs: 5.7 10*3/uL (ref 1.7–7.7)
Neutrophils Relative %: 50 %
Platelets: 315 10*3/uL (ref 150–400)
RBC: 4.32 MIL/uL (ref 4.22–5.81)
RDW: 15.3 % (ref 11.5–15.5)
WBC: 11.4 10*3/uL — ABNORMAL HIGH (ref 4.0–10.5)

## 2017-11-14 LAB — BASIC METABOLIC PANEL
Anion gap: 6 (ref 5–15)
BUN: 9 mg/dL (ref 6–20)
CO2: 28 mmol/L (ref 22–32)
Calcium: 9 mg/dL (ref 8.9–10.3)
Chloride: 103 mmol/L (ref 101–111)
Creatinine, Ser: 0.86 mg/dL (ref 0.61–1.24)
GFR calc Af Amer: >60 mL/min (ref >60)
GFR calc non Af Amer: >60 mL/min (ref >60)
Glucose, Bld: 93 mg/dL (ref 65–99)
Potassium: 4.1 mmol/L (ref 3.5–5.1)
Sodium: 137 mmol/L (ref 135–145)

## 2017-11-14 NOTE — Progress Notes (Addendum)
VASCULAR LAB PRELIMINARY  PRELIMINARY  PRELIMINARY  PRELIMINARY  Right lower extremity venous duplex completed.    Preliminary report:  There is no DVT or SVT noted in the right lower extremity.  There is a significantly enlarged inguinal lymph node noted in the right groin.   Gave report to Clydie BraunKaren, RN  Sherren KernsKANADY, Tanara Turvey, RVT 11/14/2017, 2:46 PM

## 2017-11-14 NOTE — ED Triage Notes (Signed)
Pt presents to ED for assessment of right ankle swelling and pain.  2+ Pitting upon assessment.  Pt denies injury.  Pt uses an electric chair to get around.

## 2017-11-14 NOTE — ED Notes (Signed)
PT has decided to leave, advised him against leaving but Pt does not want to wait anymore.

## 2018-08-06 DIAGNOSIS — I890 Lymphedema, not elsewhere classified: Secondary | ICD-10-CM

## 2018-08-06 DIAGNOSIS — Z1331 Encounter for screening for depression: Secondary | ICD-10-CM

## 2018-08-06 DIAGNOSIS — R7309 Other abnormal glucose: Secondary | ICD-10-CM

## 2018-08-06 DIAGNOSIS — Z6841 Body Mass Index (BMI) 40.0 and over, adult: Secondary | ICD-10-CM

## 2018-10-12 DIAGNOSIS — L0390 Cellulitis, unspecified: Secondary | ICD-10-CM

## 2018-10-12 HISTORY — DX: Cellulitis, unspecified: L03.90

## 2018-10-30 ENCOUNTER — Inpatient Hospital Stay (HOSPITAL_COMMUNITY): Payer: Non-veteran care

## 2018-10-30 ENCOUNTER — Inpatient Hospital Stay (HOSPITAL_COMMUNITY)
Admission: EM | Admit: 2018-10-30 | Discharge: 2018-11-03 | DRG: 603 | Disposition: A | Discharge status: Home / Self Care | Payer: No Typology Code available for payment source | Attending: Internal Medicine | Admitting: Internal Medicine

## 2018-10-30 ENCOUNTER — Emergency Department (HOSPITAL_BASED_OUTPATIENT_CLINIC_OR_DEPARTMENT_OTHER): Payer: No Typology Code available for payment source

## 2018-10-30 ENCOUNTER — Emergency Department (HOSPITAL_COMMUNITY): Payer: Non-veteran care

## 2018-10-30 ENCOUNTER — Other Ambulatory Visit: Payer: Self-pay

## 2018-10-30 ENCOUNTER — Encounter (HOSPITAL_COMMUNITY): Payer: Self-pay | Admitting: Emergency Medicine

## 2018-10-30 DIAGNOSIS — L0390 Cellulitis, unspecified: Secondary | ICD-10-CM | POA: Diagnosis present

## 2018-10-30 DIAGNOSIS — Z23 Encounter for immunization: Secondary | ICD-10-CM

## 2018-10-30 DIAGNOSIS — M7989 Other specified soft tissue disorders: Secondary | ICD-10-CM | POA: Diagnosis not present

## 2018-10-30 DIAGNOSIS — A46 Erysipelas: Secondary | ICD-10-CM | POA: Diagnosis present

## 2018-10-30 DIAGNOSIS — M25511 Pain in right shoulder: Secondary | ICD-10-CM | POA: Diagnosis present

## 2018-10-30 DIAGNOSIS — A5900 Urogenital trichomoniasis, unspecified: Secondary | ICD-10-CM | POA: Diagnosis present

## 2018-10-30 DIAGNOSIS — R52 Pain, unspecified: Secondary | ICD-10-CM | POA: Diagnosis not present

## 2018-10-30 DIAGNOSIS — L02415 Cutaneous abscess of right lower limb: Secondary | ICD-10-CM | POA: Diagnosis present

## 2018-10-30 DIAGNOSIS — L03115 Cellulitis of right lower limb: Principal | ICD-10-CM | POA: Diagnosis present

## 2018-10-30 DIAGNOSIS — Z6841 Body Mass Index (BMI) 40.0 and over, adult: Secondary | ICD-10-CM

## 2018-10-30 HISTORY — DX: Cellulitis, unspecified: L03.90

## 2018-10-30 HISTORY — DX: Prediabetes: R73.03

## 2018-10-30 LAB — COMPREHENSIVE METABOLIC PANEL
ALK PHOS: 57 U/L (ref 38–126)
ALT: 29 U/L (ref 0–44)
ANION GAP: 12 (ref 5–15)
AST: 34 U/L (ref 15–41)
Albumin: 3.5 g/dL (ref 3.5–5.0)
BILIRUBIN TOTAL: 1.1 mg/dL (ref 0.3–1.2)
BUN: 17 mg/dL (ref 6–20)
CO2: 26 mmol/L (ref 22–32)
Calcium: 8.8 mg/dL — ABNORMAL LOW (ref 8.9–10.3)
Chloride: 95 mmol/L — ABNORMAL LOW (ref 98–111)
Creatinine, Ser: 1.33 mg/dL — ABNORMAL HIGH (ref 0.61–1.24)
GFR, EST NON AFRICAN AMERICAN: 59 mL/min — AB (ref >60)
Glucose, Bld: 103 mg/dL — ABNORMAL HIGH (ref 70–99)
POTASSIUM: 4.1 mmol/L (ref 3.5–5.1)
Sodium: 133 mmol/L — ABNORMAL LOW (ref 135–145)
TOTAL PROTEIN: 7.5 g/dL (ref 6.5–8.1)

## 2018-10-30 LAB — CBC WITH DIFFERENTIAL/PLATELET
Abs Immature Granulocytes: 1.4 10*3/uL — ABNORMAL HIGH (ref 0.00–0.07)
BAND NEUTROPHILS: 19 %
BASOS PCT: 0 %
Basophils Absolute: 0 10*3/uL (ref 0.0–0.1)
EOS ABS: 0 10*3/uL (ref 0.0–0.5)
EOS PCT: 0 %
HEMATOCRIT: 40.2 % (ref 39.0–52.0)
Hemoglobin: 13.3 g/dL (ref 13.0–17.0)
Lymphocytes Relative: 7 %
Lymphs Abs: 1.6 10*3/uL (ref 0.7–4.0)
MCH: 28.5 pg (ref 26.0–34.0)
MCHC: 33.1 g/dL (ref 30.0–36.0)
MCV: 86.1 fL (ref 80.0–100.0)
METAMYELOCYTES PCT: 5 %
Monocytes Absolute: 0.5 10*3/uL (ref 0.1–1.0)
Monocytes Relative: 2 %
Myelocytes: 1 %
NRBC: 0 % (ref 0.0–0.2)
Neutro Abs: 19.7 10*3/uL — ABNORMAL HIGH (ref 1.7–7.7)
Neutrophils Relative %: 66 %
Platelets: 191 10*3/uL (ref 150–400)
RBC: 4.67 MIL/uL (ref 4.22–5.81)
RDW: 16.2 % — AB (ref 11.5–15.5)
WBC: 23.2 10*3/uL — AB (ref 4.0–10.5)

## 2018-10-30 LAB — LIPASE, BLOOD: Lipase: 19 U/L (ref 11–51)

## 2018-10-30 LAB — URIC ACID: URIC ACID, SERUM: 7.8 mg/dL (ref 3.7–8.6)

## 2018-10-30 MED ORDER — ENOXAPARIN SODIUM 100 MG/ML ~~LOC~~ SOLN
90.0000 mg | SUBCUTANEOUS | Status: DC
  Administered 2018-10-30 – 2018-11-02 (×4): 90 mg via SUBCUTANEOUS
  Filled 2018-10-30 (×3): qty 0.9
  Filled 2018-10-30: qty 1

## 2018-10-30 MED ORDER — ACETAMINOPHEN 650 MG RE SUPP: 650.0000 mg | Freq: Four times a day (QID) | RECTAL | Status: DC | PRN

## 2018-10-30 MED ORDER — ACETAMINOPHEN 325 MG PO TABS
650.0000 mg | ORAL_TABLET | Freq: Four times a day (QID) | ORAL | Status: DC | PRN
  Administered 2018-10-31 – 2018-11-03 (×5): 650 mg via ORAL
  Filled 2018-10-30 (×6): qty 2

## 2018-10-30 MED ORDER — SENNOSIDES-DOCUSATE SODIUM 8.6-50 MG PO TABS
1.0000 | ORAL_TABLET | Freq: Every evening | ORAL | Status: DC | PRN
  Administered 2018-11-02: 1 via ORAL
  Filled 2018-10-30: qty 1

## 2018-10-30 MED ORDER — PROMETHAZINE HCL 25 MG PO TABS
12.5000 mg | ORAL_TABLET | Freq: Four times a day (QID) | ORAL | Status: DC | PRN
  Administered 2018-10-31: 12.5 mg via ORAL
  Filled 2018-10-30: qty 1

## 2018-10-30 MED ORDER — SODIUM CHLORIDE 0.9 % IV SOLN
INTRAVENOUS | Status: AC
  Administered 2018-10-30: 17:00:00 via INTRAVENOUS

## 2018-10-30 MED ORDER — IOHEXOL 300 MG/ML  SOLN
100.0000 mL | Freq: Once | INTRAMUSCULAR | Status: AC | PRN
  Administered 2018-10-30: 100 mL via INTRAVENOUS

## 2018-10-30 MED ORDER — MORPHINE SULFATE (PF) 4 MG/ML IV SOLN
4.0000 mg | Freq: Once | INTRAVENOUS | Status: AC
  Administered 2018-10-30: 4 mg via INTRAVENOUS
  Filled 2018-10-30: qty 1

## 2018-10-30 MED ORDER — SODIUM CHLORIDE 0.9 % IV BOLUS
500.0000 mL | Freq: Once | INTRAVENOUS | Status: AC
  Administered 2018-10-30: 500 mL via INTRAVENOUS

## 2018-10-30 MED ORDER — INFLUENZA VAC SPLIT QUAD 0.5 ML IM SUSY
0.5000 mL | PREFILLED_SYRINGE | INTRAMUSCULAR | Status: AC
  Administered 2018-10-31: 0.5 mL via INTRAMUSCULAR
  Filled 2018-10-30: qty 0.5

## 2018-10-30 MED ORDER — OXYCODONE HCL 5 MG PO TABS
5.0000 mg | ORAL_TABLET | Freq: Four times a day (QID) | ORAL | Status: DC | PRN
  Administered 2018-10-30 – 2018-10-31 (×3): 5 mg via ORAL
  Filled 2018-10-30 (×3): qty 1

## 2018-10-30 MED ORDER — CLINDAMYCIN PHOSPHATE 600 MG/50ML IV SOLN
600.0000 mg | Freq: Once | INTRAVENOUS | Status: AC
  Administered 2018-10-30: 600 mg via INTRAVENOUS
  Filled 2018-10-30: qty 50

## 2018-10-30 MED ORDER — CLINDAMYCIN PHOSPHATE 600 MG/50ML IV SOLN
600.0000 mg | Freq: Three times a day (TID) | INTRAVENOUS | Status: DC
  Administered 2018-10-30 – 2018-11-03 (×11): 600 mg via INTRAVENOUS
  Filled 2018-10-30 (×16): qty 50

## 2018-10-30 NOTE — ED Provider Notes (Signed)
[facility] EMERGENCY DEPARTMENT Provider Note   CSN: [id number] Arrival date & time: 10/30/18  4782     History   Chief Complaint Chief Complaint  Patient presents with  . Abdominal Pain  . Chills  . Arm Pain    HPI Ronald Robles is a 57 y.o. male.  HPI Patient presents with right shoulder pain and right lower leg pain that started last night.  States he thinks he slept on his shoulder wrong.  He has had recurrent cellulitis of the right lower extremity.  States has had subjective fevers and chills.  Denies any shortness of breath or cough.  Denies any known injury.  Patient also states he is had some abdominal cramping associated with one loose stool and nausea.  Currently denying any abdominal pain. Past Medical History:  Diagnosis Date  . Cellulitis 10/2018  . Obesity   . Pre-diabetes     Patient Active Problem List   Diagnosis Date Noted  . Cellulitis 10/30/2018  . Cellulitis and abscess of right lower extremity 10/30/2018  . Pain in joint of right shoulder     History reviewed. No pertinent surgical history.      Home Medications    Prior to Admission medications   Medication Sig Start Date End Date Taking? Authorizing Provider  cephALEXin (KEFLEX) 500 MG capsule Take 1 capsule (500 mg total) by mouth 4 (four) times daily. Patient not taking: Reported on 10/30/2018 05/02/17   Ward, Layla Maw, DO  ondansetron (ZOFRAN ODT) 4 MG disintegrating tablet Take 1 tablet (4 mg total) by mouth every 8 (eight) hours as needed for nausea or vomiting. Patient not taking: Reported on 10/30/2018 05/02/17   Ward, Layla Maw, DO  traMADol (ULTRAM) 50 MG tablet Take 1 tablet (50 mg total) by mouth every 6 (six) hours as needed. Patient not taking: Reported on 10/30/2018 05/02/17   Ward, Layla Maw, DO    Family History History reviewed. No pertinent family history.  Social History Social History   Tobacco Use  . Smoking status: Never Smoker  . Smokeless  tobacco: Never Used  Substance Use Topics  . Alcohol use: No  . Drug use: No     Allergies   Patient has no known allergies.   Review of Systems Review of Systems  Constitutional: Positive for chills and fever. Negative for fatigue.  HENT: Negative for sore throat and trouble swallowing.   Eyes: Negative for visual disturbance.  Respiratory: Negative for cough and shortness of breath.   Cardiovascular: Positive for leg swelling. Negative for chest pain and palpitations.  Gastrointestinal: Positive for abdominal pain, diarrhea and nausea. Negative for blood in stool, constipation and vomiting.  Genitourinary: Negative for dysuria, flank pain, frequency and hematuria.  Musculoskeletal: Positive for arthralgias and myalgias. Negative for neck pain and neck stiffness.  Skin: Negative for rash and wound.  Neurological: Negative for dizziness, weakness, light-headedness, numbness and headaches.  All other systems reviewed and are negative.    Physical Exam Updated Vital Signs BP (!) 157/85 (BP Location: Left Arm)   Pulse 92   Temp 98.4 F (36.9 C) (Oral)   Resp 19   Ht 5\' 11"  (1.803 m)   Wt (!) 191.9 kg   SpO2 95%   BMI 59.00 kg/m   Physical Exam Vitals signs and nursing note reviewed.  Constitutional:      Appearance: Normal appearance. He is well-developed. He is obese. He is not ill-appearing.  HENT:     Head: Normocephalic  and atraumatic.     Comments: Cranial nerves II through XII grossly intact.    Nose: Nose normal. No congestion.  Eyes:     Pupils: Pupils are equal, round, and reactive to light.  Neck:     Musculoskeletal: Normal range of motion and neck supple. No neck rigidity or muscular tenderness.  Cardiovascular:     Rate and Rhythm: Normal rate and regular rhythm.     Pulses: Normal pulses.     Heart sounds: Normal heart sounds. No murmur. No friction rub. No gallop.   Pulmonary:     Effort: Pulmonary effort is normal. No respiratory distress.      Breath sounds: Normal breath sounds. No stridor. No wheezing, rhonchi or rales.     Comments: Right upper chest wall tenderness to palpation Chest:     Chest wall: Tenderness present.  Abdominal:     General: Bowel sounds are normal. There is no distension.     Palpations: Abdomen is soft.     Tenderness: There is no abdominal tenderness. There is no guarding or rebound.     Comments: Obese abdomen but soft and nontender to palpation.  Musculoskeletal: Normal range of motion.        General: Swelling and tenderness present.     Right lower leg: Edema present.     Left lower leg: No edema.     Comments: Patient has tenderness to palpation to light touch over the right upper chest wall.  Right distal clavicle and right shoulder.  Pain with minimal range of motion.  No definite warmth or deformity.  Full range of motion of the right elbow and wrist.  Patient has right lower extremity from the calf to the foot swelling, erythema, warmth and tenderness to palpation.  No crepitance.  Distal pulses intact.  Lymphadenopathy:     Cervical: No cervical adenopathy.  Skin:    General: Skin is warm and dry.     Findings: No erythema or rash.  Neurological:     General: No focal deficit present.     Mental Status: He is alert and oriented to person, place, and time.     Comments: There is some limitation of neurologic exam based on patient's pain of the right arm and right leg.  He has equal bilateral grip strength.  No focal deficit appreciated.  Sensation to light touch intact.  Psychiatric:        Behavior: Behavior normal.      ED Treatments / Results  Labs (all labs ordered are listed, but only abnormal results are displayed) Labs Reviewed  CBC WITH DIFFERENTIAL/PLATELET - Abnormal; Notable for the following components:      Result Value   WBC 23.2 (*)    RDW 16.2 (*)    Neutro Abs 19.7 (*)    Abs Immature Granulocytes 1.40 (*)    All other components within normal limits    COMPREHENSIVE METABOLIC PANEL - Abnormal; Notable for the following components:   Sodium 133 (*)    Chloride 95 (*)    Glucose, Bld 103 (*)    Creatinine, Ser 1.33 (*)    Calcium 8.8 (*)    GFR calc non Af Amer 59 (*)    All other components within normal limits  URINALYSIS, ROUTINE W REFLEX MICROSCOPIC - Abnormal; Notable for the following components:   Glucose, UA 100 (*)    Hgb urine dipstick SMALL (*)    Bilirubin Urine SMALL (*)    Protein, ur  100 (*)    All other components within normal limits  CBC - Abnormal; Notable for the following components:   WBC 21.7 (*)    Hemoglobin 12.1 (*)    HCT 36.6 (*)    RDW 16.0 (*)    All other components within normal limits  BASIC METABOLIC PANEL - Abnormal; Notable for the following components:   Sodium 132 (*)    Chloride 96 (*)    Glucose, Bld 109 (*)    Calcium 8.4 (*)    All other components within normal limits  URINALYSIS, MICROSCOPIC (REFLEX) - Abnormal; Notable for the following components:   Bacteria, UA RARE (*)    Trichomonas, UA PRESENT (*)    All other components within normal limits  GLUCOSE, CAPILLARY - Abnormal; Notable for the following components:   Glucose-Capillary 102 (*)    All other components within normal limits  CULTURE, BLOOD (ROUTINE X 2)  CULTURE, BLOOD (ROUTINE X 2)  BODY FLUID CULTURE  URIC ACID  LIPASE, BLOOD  HIV ANTIBODY (ROUTINE TESTING W REFLEX)  SYNOVIAL CELL COUNT + DIFF, W/ CRYSTALS  GC/CHLAMYDIA PROBE AMP ([place]) NOT AT [facility]    EKG None  Radiology Dg Shoulder Right  Result Date: 10/30/2018 CLINICAL DATA:  Right shoulder pain.  No known injury. EXAM: RIGHT SHOULDER - 2+ VIEW COMPARISON:  None. FINDINGS: Degenerative changes in the [facility] joint with joint space narrowing and spurring. Glenohumeral joint is maintained. No acute bony abnormality. Specifically, no fracture, subluxation, or dislocation. Soft tissues are intact. IMPRESSION: Degenerative changes in the right AC joint. No  acute bony abnormality. Electronically Signed   By: Charlett NoseKevin  Dover M.D.   On: 10/30/2018 08:34   Ct Shoulder Right W Contrast  Result Date: 10/30/2018 CLINICAL DATA:  Acute right shoulder pain for the past 2 days with swelling and redness. Leukocytosis. Evaluate for abscess. EXAM: CT OF THE UPPER RIGHT EXTREMITY WITH CONTRAST TECHNIQUE: Multidetector CT imaging of the upper right extremity was performed according to the standard protocol following intravenous contrast administration. COMPARISON:  Right shoulder x-rays from same day. CONTRAST:  100mL OMNIPAQUE IOHEXOL 300 MG/ML  SOLN FINDINGS: Evaluation is limited due to motion artifact as well as streak artifact related to patient's large body habitus. Bones/Joint/Cartilage No acute fracture or dislocation. No cortical destruction or periosteal reaction. Minimal acromioclavicular osteoarthritis. No large joint effusion. Ligaments Suboptimally assessed by CT. Muscles and Tendons Unremarkable.  No muscle atrophy. Soft tissues No fluid collection. No soft tissue mass. Visualized right lung is clear. IMPRESSION: 1. Limited study predominantly due to streak artifact related to patient's large body habitus. 2. No abscess. 3.  No acute osseous abnormality.  No large joint effusion. Electronically Signed   By: Obie DredgeWilliam T Derry M.D.   On: 10/30/2018 23:37   Vas [place] Lower Extremity Venous (dvt) (only Mc & Wl)  Result Date: 10/30/2018  Lower Venous Study Indications: Pain, and Swelling.  Limitations: Body habitus and edema. Comparison Study: Prior negative study from 11/14/17 is available for comparison Performing Technologist: Sherren Kernsandace Kanady RVS  Examination Guidelines: A complete evaluation includes B-mode imaging, spectral Doppler, color Doppler, and power Doppler as needed of all accessible portions of each vessel. Bilateral testing is considered an integral part of a complete examination. Limited examinations for reoccurring indications may be performed as noted.   Right Venous Findings: +---------+---------------+---------+-----------+----------+-------+          CompressibilityPhasicitySpontaneityPropertiesSummary +---------+---------------+---------+-----------+----------+-------+ CFV      Full  Yes      Yes                          +---------+---------------+---------+-----------+----------+-------+ FV Prox  Full                                                 +---------+---------------+---------+-----------+----------+-------+ FV Mid   Full                                                 +---------+---------------+---------+-----------+----------+-------+ FV DistalFull                                                 +---------+---------------+---------+-----------+----------+-------+ POP      Full           Yes      Yes                          +---------+---------------+---------+-----------+----------+-------+ PTV      Full                                                 +---------+---------------+---------+-----------+----------+-------+ GSV      Full                                                 +---------+---------------+---------+-----------+----------+-------+  Right Technical Findings: Not visualized segments include SFJ, profunda, and peroneal veins.  Left Venous Findings: +---+---------------+---------+-----------+----------+-------+    CompressibilityPhasicitySpontaneityPropertiesSummary +---+---------------+---------+-----------+----------+-------+ CFV               Yes      Yes                          +---+---------------+---------+-----------+----------+-------+    Summary: Right: There is no evidence of deep vein thrombosis in the lower extremity. However, portions of this examination were limited- see technologist comments above. Ultrasound characteristics of enlarged lymph nodes are noted in the groin. Left: No evidence of common femoral vein obstruction.  *See table(s)  above for measurements and observations. Electronically signed by Fabienne Bruns MD on 10/30/2018 at 1:54:49 PM.    Final     Procedures Procedures (including critical care time)  Medications Ordered in ED Medications  Influenza vac split quadrivalent PF (FLUARIX) injection 0.5 mL (has no administration in time range)  enoxaparin (LOVENOX) injection 90 mg (90 mg Subcutaneous Given 10/30/18 2150)  0.9 %  sodium chloride infusion ( Intravenous New Bag/Given 10/30/18 1645)  acetaminophen (TYLENOL) tablet 650 mg (650 mg Oral Given 10/31/18 0115)    Or  acetaminophen (TYLENOL) suppository 650 mg ( Rectal See Alternative 10/31/18 0115)  senna-docusate (Senokot-S) tablet 1 tablet (has no administration in time range)  promethazine (PHENERGAN) tablet 12.5 mg (12.5 mg  Oral Given 10/31/18 0803)  oxyCODONE (Oxy IR/ROXICODONE) immediate release tablet 5 mg (5 mg Oral Given 10/31/18 0802)  clindamycin (CLEOCIN) IVPB 600 mg (600 mg Intravenous New Bag/Given 10/31/18 0744)  clindamycin (CLEOCIN) IVPB 600 mg (0 mg Intravenous Stopped 10/30/18 1122)  sodium chloride 0.9 % bolus 500 mL (0 mLs Intravenous Stopped 10/30/18 1155)  morphine 4 MG/ML injection 4 mg (4 mg Intravenous Given 10/30/18 1046)  iohexol (OMNIPAQUE) 300 MG/ML solution 100 mL (100 mLs Intravenous Contrast Given 10/30/18 2117)  metroNIDAZOLE (FLAGYL) tablet 2,000 mg (2,000 mg Oral Given 10/31/18 0802)     Initial Impression / Assessment and Plan / ED Course  I have reviewed the triage vital signs and the nursing notes.  Pertinent labs & imaging results that were available during my care of the patient were reviewed by me and considered in my medical decision making (see chart for details).     Patient with degenerative changes of the [facility] joint on the right.  Appears to have a right lower extremity cellulitis.  No evidence of DVT on ultrasound.  Given rapid nature of cellulitis, IV antibiotics started and discussed with general medicine  service regarding admission.  Final Clinical Impressions(s) / ED Diagnoses   Final diagnoses:  Cellulitis of right lower extremity  Arthralgia of right acromioclavicular joint    ED Discharge Orders    None       Loren Racer, MD 10/31/18 [phone]

## 2018-10-30 NOTE — Progress Notes (Signed)
Pt came to MRI pt could not go into largest scanner for the exam. Managed to get pt about halfway into position and table would not advance any further and pt said he could not tolerate the discomfort.

## 2018-10-30 NOTE — H&P (Addendum)
Date: 10/30/2018               Patient Name:  Ronald Robles MRN: [id number]  DOB: 1960-12-04 Age / Sex: 57 y.o., male   PCP: System, Pcp Not In         Medical Service: Internal Medicine Teaching Service         Attending Physician: Dr. Inez CatalinaMullen, Emily B, MD    First Contact: Dr. Judeth CornfieldLee, Howard Bunte, MD Pager: [phone]  Second Contact: Dr. Lorenso Courierhundi, Vahini, MD Pager: [phone]       After Hours (After 5p/  First Contact Pager: [phone]  weekends / holidays): Second Contact Pager: [phone]   Chief Complaint: Right shoulder pain  History of Present Illness: Ronald Robles is a 57 yo M w/ PMH of morbid obesity presenting with right sided shoulder and leg pain. He was in his usual state of health until 3 weeks ago when he developed right leg swelling and pain. He had lower extremity edema in the past treated with diuretics at the [facility] [place] and he assumed it was a similar issue despite it being unilateral. He began to have chills, nausea, diarrhea and malaise last Friday which he tried to 'get through' by staying in bed all day. This morning, he woke up with significant R shoulder pain with inability to move or lift his arm at all and he came to the ED for evaluation. He denies any recent travel, rash, headache, blurry vision, chest pain, palpitations, dyspnea.  In the ED he was found to be afebrile, tachypnea, white count of 23.2. He was given 1L NS and started on clindamycin for presumed cellulitis. Scan of bilateral lower extremities showed no DVT. X-ray of his shoulder showed no abnormalities  Meds:  None  Allergies: Allergies as of 10/30/2018  . (No Known Allergies)   Past Medical History:  Diagnosis Date  . Cellulitis 10/2018  . Obesity   . Pre-diabetes     Family History:  No significant family history  Social History: Works as a Radio broadcast assistantnewspaper deliverer. Has a girlfriend. Denies any alcohol, tobacco, illicit substance history  Social History   Tobacco Use  . Smoking status: Never  Smoker  . Smokeless tobacco: Never Used  Substance Use Topics  . Alcohol use: No  . Drug use: No    Review of Systems: A complete ROS was negative except as per HPI.   Physical Exam: Blood pressure (!) 145/74, pulse (!) 102, temperature 98.9 F (37.2 C), temperature source Oral, resp. rate 19, height 5\' 11"  (1.803 m), weight (!) 191.9 kg, SpO2 98 %. Physical Exam  Constitutional: He is oriented to person, place, and time. No distress.  Morbidly obese  HENT:  Head: Normocephalic and atraumatic.  Mouth/Throat: Oropharynx is clear and moist. No oropharyngeal exudate.  Eyes: Pupils are equal, round, and reactive to light. Conjunctivae and EOM are normal. No scleral icterus.  Neck: Normal range of motion. Neck supple.  Cardiovascular: Normal rate, regular rhythm, normal heart sounds and intact distal pulses.  Pulmonary/Chest: Effort normal.  Distant breath sounds  Abdominal: Soft. Bowel sounds are normal. He exhibits no distension. There is no abdominal tenderness.  Musculoskeletal:        General: Tenderness (R shoulder pain with palpable effusion. Exquisitely tender to palpation. Active ROM completely limited by pain. Passive ROM also limited by pain) and edema (RLE non-pitting edema from ankle to knee w/ erythematous skin, exquisitely tender to palpation) present.  Neurological: He is alert and oriented to  person, place, and time. No cranial nerve deficit. GCS score is 15.  Skin: Skin is warm and dry. He is not diaphoretic. There is erythema (Right lower extremity edema).   EKG: personally reviewed my interpretation is sinus rhythm with PVCs, left axis deviation, no ST elevation or depression, no T wave inversions.  CXR: N/A  Assessment & Plan by Problem: Active Problems:   Cellulitis   Cellulitis and abscess of right lower extremity   Ronald Robles is a 57 yo M w/ PMH of morbid obesity presenting with right sided shoulder and leg pain. He has leukocytosis with left shift concerning  for bacterial infection. His blood culture were collected in the ED and we will guide our abx regimen based on the results. For now, his right shoulder presentation is concerning for septic arthritis given his leukocytosis and physical exam findings and it will need to be aspirated for further evaluation.  Right Shoulder pain 2/2 septic arthritis vs gout Right Leg Swelling 2/2 cellulitis WBC 23.2. Systemic illnesses w/ nausea, diarrhea, malaise and chills. No prior hx of gout. Exquisite tenderness to palpation.  - Shoulder joint aspiration - Ortho consult - C/w clindamycin - Oxy IR 5mg  q6hr for pain control - 1L NS bolus + 100cc/hr - F/u UA, Blood culture   DVT prophx: subqhep Diet: Cardiac Code: Full  Dispo: Admit patient to Observation with expected length of stay less than 2 midnights.  Signed: Theotis BarrioLee, Ashey Tramontana K, MD 10/30/2018, 4:38 PM  Pager: [phone]

## 2018-10-30 NOTE — ED Notes (Signed)
Pt given food and beverage per Dr. Ranae PalmsYelverton

## 2018-10-30 NOTE — Consult Note (Signed)
ORTHOPAEDIC CONSULTATION  REQUESTING PHYSICIAN: Sid Falcon, MD  Chief Complaint: Right shoulder pain  HPI: Ronald Robles is a 57 y.o. male with super morbid obesity and a BMI of 59 who reports minimal other medical comorbidities.  He has 2 days of worsening pain in his right shoulder.  Atraumatic onset.  He is swelling the anterior shoulder and redness as well as elevated white count and possible fever and chills at home.  No other areas of pain reported.  No shortness of breath.  He works as a Research scientist (physical sciences) person.  Past Medical History:  Diagnosis Date  . Cellulitis 10/2018  . Obesity   . Pre-diabetes    History reviewed. No pertinent surgical history. Social History   Socioeconomic History  . Marital status: Married    Spouse name: Not on file  . Number of children: Not on file  . Years of education: Not on file  . Highest education level: Not on file  Occupational History  . Not on file  Social Needs  . Financial resource strain: Not on file  . Food insecurity:    Worry: Not on file    Inability: Not on file  . Transportation needs:    Medical: Not on file    Non-medical: Not on file  Tobacco Use  . Smoking status: Never Smoker  . Smokeless tobacco: Never Used  Substance and Sexual Activity  . Alcohol use: No  . Drug use: No  . Sexual activity: Not on file  Lifestyle  . Physical activity:    Days per week: Not on file    Minutes per session: Not on file  . Stress: Not on file  Relationships  . Social connections:    Talks on phone: Not on file    Gets together: Not on file    Attends religious service: Not on file    Active member of club or organization: Not on file    Attends meetings of clubs or organizations: Not on file    Relationship status: Not on file  Other Topics Concern  . Not on file  Social History Narrative  . Not on file   History reviewed. No pertinent family history. No Known Allergies Prior to Admission medications    Medication Sig Start Date End Date Taking? Authorizing Provider  cephALEXin (KEFLEX) 500 MG capsule Take 1 capsule (500 mg total) by mouth 4 (four) times daily. Patient not taking: Reported on 10/30/2018 05/02/17   Ward, Delice Bison, DO  ondansetron (ZOFRAN ODT) 4 MG disintegrating tablet Take 1 tablet (4 mg total) by mouth every 8 (eight) hours as needed for nausea or vomiting. Patient not taking: Reported on 10/30/2018 05/02/17   Ward, Delice Bison, DO  traMADol (ULTRAM) 50 MG tablet Take 1 tablet (50 mg total) by mouth every 6 (six) hours as needed. Patient not taking: Reported on 10/30/2018 05/02/17   Ward, Delice Bison, DO   Dg Shoulder Right  Result Date: 10/30/2018 CLINICAL DATA:  Right shoulder pain.  No known injury. EXAM: RIGHT SHOULDER - 2+ VIEW COMPARISON:  None. FINDINGS: Degenerative changes in the [facility] joint with joint space narrowing and spurring. Glenohumeral joint is maintained. No acute bony abnormality. Specifically, no fracture, subluxation, or dislocation. Soft tissues are intact. IMPRESSION: Degenerative changes in the right AC joint. No acute bony abnormality. Electronically Signed   By: Rolm Baptise M.D.   On: 10/30/2018 08:34   Vas [place] Lower Extremity Venous (dvt) (only [facility])  Result Date: 10/30/2018  Lower Venous Study Indications: Pain, and Swelling.  Limitations: Body habitus and edema. Comparison Study: Prior negative study from 11/14/17 is available for comparison Performing Technologist: Sharion Dove RVS  Examination Guidelines: A complete evaluation includes B-mode imaging, spectral Doppler, color Doppler, and power Doppler as needed of all accessible portions of each vessel. Bilateral testing is considered an integral part of a complete examination. Limited examinations for reoccurring indications may be performed as noted.  Right Venous Findings: +---------+---------------+---------+-----------+----------+-------+           CompressibilityPhasicitySpontaneityPropertiesSummary +---------+---------------+---------+-----------+----------+-------+ CFV      Full           Yes      Yes                          +---------+---------------+---------+-----------+----------+-------+ FV Prox  Full                                                 +---------+---------------+---------+-----------+----------+-------+ FV Mid   Full                                                 +---------+---------------+---------+-----------+----------+-------+ FV DistalFull                                                 +---------+---------------+---------+-----------+----------+-------+ POP      Full           Yes      Yes                          +---------+---------------+---------+-----------+----------+-------+ PTV      Full                                                 +---------+---------------+---------+-----------+----------+-------+ GSV      Full                                                 +---------+---------------+---------+-----------+----------+-------+  Right Technical Findings: Not visualized segments include SFJ, profunda, and peroneal veins.  Left Venous Findings: +---+---------------+---------+-----------+----------+-------+    CompressibilityPhasicitySpontaneityPropertiesSummary +---+---------------+---------+-----------+----------+-------+ CFV               Yes      Yes                          +---+---------------+---------+-----------+----------+-------+    Summary: Right: There is no evidence of deep vein thrombosis in the lower extremity. However, portions of this examination were limited- see technologist comments above. Ultrasound characteristics of enlarged lymph nodes are noted in the groin. Left: No evidence of common femoral vein obstruction.  *See table(s) above for measurements and observations. Electronically signed by Ruta Hinds MD on 10/30/2018  at  1:54:49 PM.    Final    Family History Reviewed and non-contributory, no pertinent history of problems with bleeding or anesthesia      Review of Systems 14 system ROS conducted and negative except for that noted in HPI   OBJECTIVE  Vitals: Patient Vitals for the past 8 hrs:  BP Temp Temp src Pulse Resp SpO2  10/30/18 1754 122/63 (!) 102.6 F (39.2 C) Oral (!) 118 20 95 %  10/30/18 1305 (!) 145/74 98.9 F (37.2 C) Oral (!) 102 19 98 %  10/30/18 1200 (!) 144/82 - - 94 18 96 %  10/30/18 1130 (!) 152/79 - - 94 16 95 %  General: Alert, no acute distress Cardiovascular: No pedal edema Respiratory: No cyanosis, no use of accessory musculature GI: No organomegaly, abdomen is soft and non-tender Skin: No lesions in the area of chief complaint other than those listed below in MSK exam.  Neurologic: Sensation intact distally save for the below mentioned MSK exam Psychiatric: Patient is competent for consent with normal mood and affect Lymphatic: Swelling in the anterior right shoulder RUE: Patient has pain with internal and external rotation of the shoulder is primarily in the anterior part of the shoulder.  Is unable to really forward elevate.  Distal motor and sensory function preserved.  Actually nerve appears to be firing.  Fullness in the anterior shoulder concerning for a abscess or cellulitis.  Difficult exam due to patient size and pain.  Not particularly tender at the [facility] joint.    Test Results Imaging X-rays of the shoulder 2 views demonstrate no obvious acute osseous abnormalities.  Labs cbc Recent Labs    10/30/18 0750  WBC 23.2*  HGB 13.3  HCT 40.2  PLT 191    Labs inflam No results for input(s): CRP in the last 72 hours.  Invalid input(s): ESR  Labs coag No results for input(s): INR, PTT in the last 72 hours.  Invalid input(s): PT  Recent Labs    10/30/18 0750  NA 133*  K 4.1  CL 95*  CO2 26  GLUCOSE 103*  BUN 17  CREATININE 1.33*  CALCIUM 8.8*      ASSESSMENT AND PLAN: 57 y.o. male with the following: Right anterior shoulder fullness and pain, dry tap though patient habitus really limits her ability to tap the joint.  Patient is extraordinarily large and this makes his ability to have a shoulder aspiration without image guidance extremely difficult.  However his posteriorly directed aspiration attempt was negative for any fluid.  He does have anterior fullness and we were hesitant to stick a needle in this to not have it track to the joint.  We feel that he may have a anterior fluid collection and may benefit from an MRI or a CT scan overnight to determine if this fluid collection is in the joint or if this is merely a cellulitis picture.  We are tentatively posting for the operating room for an open washout of a superficial abscess however if his MRI change this picture we may reassess and cancel.  He understands the nature of his condition and the procedure this been described all questions were answered we will review imaging and formulate a further plan tomorrow.  Keep n.p.o. overnight.

## 2018-10-30 NOTE — ED Triage Notes (Signed)
Pt reports chills, bodyaches, and abdominal pain, r side weakness since last night.

## 2018-10-30 NOTE — Progress Notes (Signed)
VASCULAR LAB PRELIMINARY  PRELIMINARY  PRELIMINARY  PRELIMINARY  Right lower extremity venous duplex completed.    Preliminary report:  There is no obvious evidence of DVT or SVT noted in the right lower extremity.    Called results to Dr. Lafayette DragonYelverton  Jamee Keach, [facility], RVT 10/30/2018, 8:32 AM

## 2018-10-31 ENCOUNTER — Encounter (HOSPITAL_COMMUNITY)
Admission: EM | Disposition: A | Discharge status: Home / Self Care | Payer: Self-pay | Source: Home / Self Care | Attending: Internal Medicine

## 2018-10-31 DIAGNOSIS — M25511 Pain in right shoulder: Secondary | ICD-10-CM | POA: Diagnosis not present

## 2018-10-31 DIAGNOSIS — A5900 Urogenital trichomoniasis, unspecified: Secondary | ICD-10-CM | POA: Diagnosis not present

## 2018-10-31 DIAGNOSIS — L03115 Cellulitis of right lower limb: Secondary | ICD-10-CM | POA: Diagnosis not present

## 2018-10-31 LAB — URINALYSIS, ROUTINE W REFLEX MICROSCOPIC
GLUCOSE, UA: 100 mg/dL — AB
Ketones, ur: NEGATIVE mg/dL
LEUKOCYTES UA: NEGATIVE
Nitrite: NEGATIVE
PROTEIN: 100 mg/dL — AB
Specific Gravity, Urine: 1.025 (ref 1.005–1.030)
pH: 7 (ref 5.0–8.0)

## 2018-10-31 LAB — BASIC METABOLIC PANEL
Anion gap: 13 (ref 5–15)
BUN: 11 mg/dL (ref 6–20)
CO2: 23 mmol/L (ref 22–32)
Calcium: 8.4 mg/dL — ABNORMAL LOW (ref 8.9–10.3)
Chloride: 96 mmol/L — ABNORMAL LOW (ref 98–111)
Creatinine, Ser: 1.14 mg/dL (ref 0.61–1.24)
GFR calc Af Amer: >60 mL/min (ref >60)
GFR calc non Af Amer: >60 mL/min (ref >60)
Glucose, Bld: 109 mg/dL — ABNORMAL HIGH (ref 70–99)
Potassium: 3.5 mmol/L (ref 3.5–5.1)
Sodium: 132 mmol/L — ABNORMAL LOW (ref 135–145)

## 2018-10-31 LAB — CBC
HCT: 36.6 % — ABNORMAL LOW (ref 39.0–52.0)
Hemoglobin: 12.1 g/dL — ABNORMAL LOW (ref 13.0–17.0)
MCH: 28.3 pg (ref 26.0–34.0)
MCHC: 33.1 g/dL (ref 30.0–36.0)
MCV: 85.7 fL (ref 80.0–100.0)
Platelets: 205 10*3/uL (ref 150–400)
RBC: 4.27 MIL/uL (ref 4.22–5.81)
RDW: 16 % — ABNORMAL HIGH (ref 11.5–15.5)
WBC: 21.7 10*3/uL — ABNORMAL HIGH (ref 4.0–10.5)
nRBC: 0 % (ref 0.0–0.2)

## 2018-10-31 LAB — URINALYSIS, MICROSCOPIC (REFLEX)

## 2018-10-31 LAB — HIV ANTIBODY (ROUTINE TESTING W REFLEX): HIV Screen 4th Generation wRfx: NONREACTIVE

## 2018-10-31 LAB — GLUCOSE, CAPILLARY: GLUCOSE-CAPILLARY: 102 mg/dL — AB (ref 70–99)

## 2018-10-31 SURGERY — INCISION AND DRAINAGE, ABSCESS
Anesthesia: General | Laterality: Right

## 2018-10-31 MED ORDER — METRONIDAZOLE 500 MG PO TABS
2000.0000 mg | ORAL_TABLET | Freq: Once | ORAL | Status: AC
  Administered 2018-10-31: 2000 mg via ORAL
  Filled 2018-10-31: qty 4

## 2018-10-31 MED ORDER — OXYCODONE HCL 5 MG PO TABS
10.0000 mg | ORAL_TABLET | ORAL | Status: DC | PRN
  Administered 2018-10-31: 5 mg via ORAL
  Administered 2018-11-01 – 2018-11-03 (×8): 10 mg via ORAL
  Filled 2018-10-31 (×9): qty 2

## 2018-10-31 NOTE — Progress Notes (Signed)
Orthopedic Tech Progress Note Patient Details:  Ronald Robles 1961/09/15 [id number]  Applied with Ronald Robles    Ortho Devices Type of Ortho Device: Arm sling Ortho Device/Splint Location: URE Ortho Device/Splint Interventions: Application, Adjustment       Ronald Robles 10/31/2018, 5:15 PM

## 2018-10-31 NOTE — Care Management Note (Signed)
Case Management Note  Patient Details  Name: Ronald Robles MRN: [id number] Date of Birth: 09-12-1961  Subjective/Objective: 57 yo male presented with right shoulder pain.                    Action/Plan: CM attempted x 2 to meet with patient to discuss POC with patient resting and requesting CM f/u at a later time. CM team will continue to follow.   Expected Discharge Date:                  Expected Discharge Plan:  Home/Self Care  In-House Referral:  NA  Discharge planning Services  CM Consult  Post Acute Care Choice:  NA Choice offered to:  NA  DME Arranged:  N/A DME Agency:  NA  HH Arranged:  NA HH Agency:  NA  Status of Service:  In process, will continue to follow  If discussed at Long Length of Stay Meetings, dates discussed:    Additional Comments:  Colleen CanNatalie Lonia Roane RN, BSN, NCM-BC, ACM-RN [phone] 10/31/2018, 3:50 PM

## 2018-10-31 NOTE — Progress Notes (Addendum)
   Subjective:  Armandina GemmaGregory Horacek is a 57 y.o. with PMH of morbid obesity admit for R shoulder and leg infection on hospital day 1  Mr.Seivert was examined and evaluated at bedside this AM. He had some fever overnight and had poor sleep due to multiple procedures and imaging. He states he is continuing to endorse significant shoulder pain but states he is able to move his arm slightly more than yesterday. Also continuing to endorse leg pain but he states the swelling appears to have improved compared to yesterday. Denies any N/V/D/C.   Objective:  Vital signs in last 24 hours: Vitals:   10/31/18 0050 10/31/18 0452 10/31/18 0720 10/31/18 1102  BP:   (!) 157/85 (!) 153/83  Pulse:   92 86  Resp:   19 20  Temp: (!) 101.4 F (38.6 C)  98.4 F (36.9 C) 98.7 F (37.1 C)  TempSrc: Oral  Oral Oral  SpO2:   95% 97%  Weight:  (!) 191.9 kg    Height:       Physical Exam  Constitutional: No distress.  Morbidly obese  HENT:  Head: Normocephalic and atraumatic.  Mouth/Throat: Oropharynx is clear and moist. No oropharyngeal exudate.  Eyes: Pupils are equal, round, and reactive to light. Conjunctivae and EOM are normal. No scleral icterus.  Neck: Normal range of motion. Neck supple.  Cardiovascular: Normal rate, regular rhythm, normal heart sounds and intact distal pulses.  Pulmonary/Chest: Effort normal.  Distant breath sounds  Abdominal: Soft. Bowel sounds are normal. There is no abdominal tenderness.  Musculoskeletal:        General: Tenderness (Right shoulder with significant tenderness able to passively extend and flex ~20 degrees. No active ROM yet.) and edema (Significant non-pitting edema of right lower extremity from ankle to shin with erythema and tenderness to palpation.) present.  Skin: He is not diaphoretic.   Assessment/Plan:  Active Problems:   Cellulitis   Cellulitis and abscess of right lower extremity   Pain in joint of right shoulder  Mr.Rossin is a 57 yo M w/ PMH of morbid  obesity admit for cellulitis of R shoulder and right leg. He continues to be febrile overnight and his leukocytosis has barely moved but his shoulder pain appears to be improving. CT shoulder showed no fluid collection and Ortho does not deem surgical intervention necessary at this time. Continuing inpatient stay for IV abx therapy.  Painful right shoulder and right leg 2/2 cellulitis WBC 23.2->21.7. Currently on IV clindamycin. Febrile at 102.6 overnight. Blood cultures NGTD. CT show no abscess or joint effusion.  - Appreciate ortho recs - C/w IV clindamycin 600mg  q8hrs - F/u blood cultures - Tylenol for fevers - Oxy 5mg  q6hr pRN for pain  Trichominiasis UA + for trich. Describe unprotected sexual history. HIV negative - Chlamydia/Gonorrhea screen - 2g metronidazole PO  DVT prophx: Lovenox Diet: Diabetic Bowel: Senokot Code: Full  Dispo: Anticipated discharge in approximately 1-2 day(s).   Theotis BarrioLee, Lynann Demetrius K, MD 10/31/2018, 6:15 PM Pager: [phone]

## 2018-10-31 NOTE — Progress Notes (Signed)
ORTHOPAEDIC PROGRESS NOTE  SUBJECTIVE: Mild improvement in R arm symptoms compared to last night.    OBJECTIVE: PE:  Tolerates 30 deg external rotation, really not able to tolerate flexion actively, passively has 40 degrees before pain restricts motion, all improved from yesterday  Vitals:   10/31/18 0050 10/31/18 0720  BP:  (!) 157/85  Pulse:  92  Resp:  19  Temp: (!) 101.4 F (38.6 C) 98.4 F (36.9 C)  SpO2:  95%   CT: no fluid collection, no joint effusion  ASSESSMENT: Ronald Robles is a 57 y.o. male with R shoulder pain, likely cellulitis with dry aspiration, no fluid collection on CT and no joint effusion on CT  PLAN:  At this point patient appears to be more a cellulitic picture than a surgical abscess or septic joint picture.  Defer to primary for antibiotic coverage.  Unable to get MRI due to patient size.  Removed from surgery schedule as no surgical indication now.

## 2018-11-01 DIAGNOSIS — A599 Trichomoniasis, unspecified: Secondary | ICD-10-CM

## 2018-11-01 DIAGNOSIS — L03115 Cellulitis of right lower limb: Secondary | ICD-10-CM | POA: Diagnosis not present

## 2018-11-01 DIAGNOSIS — M25511 Pain in right shoulder: Secondary | ICD-10-CM | POA: Diagnosis not present

## 2018-11-01 LAB — CBC
HCT: 37.3 % — ABNORMAL LOW (ref 39.0–52.0)
Hemoglobin: 12 g/dL — ABNORMAL LOW (ref 13.0–17.0)
MCH: 27.3 pg (ref 26.0–34.0)
MCHC: 32.2 g/dL (ref 30.0–36.0)
MCV: 85 fL (ref 80.0–100.0)
Platelets: 189 10*3/uL (ref 150–400)
RBC: 4.39 MIL/uL (ref 4.22–5.81)
RDW: 15.7 % — ABNORMAL HIGH (ref 11.5–15.5)
WBC: 18 10*3/uL — ABNORMAL HIGH (ref 4.0–10.5)
nRBC: 0 % (ref 0.0–0.2)

## 2018-11-01 NOTE — Progress Notes (Signed)
   Subjective:   [name] states that he feels that his right leg pain has improved somewhat. He has noticed that the dorsal aspect of his right foot is sore. He continues to have discomfort in his right shoulder.   Objective:  Vital signs in last 24 hours: Vitals:   10/31/18 1102 10/31/18 1842 10/31/18 2014 11/01/18 0522  BP: (!) 153/83 140/82 (!) 143/79 138/66  Pulse: 86 91 92 71  Resp: 20 18 18 18   Temp: 98.7 F (37.1 C) 99.7 F (37.6 C) 99.6 F (37.6 C) 98.5 F (36.9 C)  TempSrc: Oral Oral Oral Oral  SpO2: 97% 93% 95% 100%  Weight:    (!) 185.3 kg  Height:       Physical Exam  Constitutional: He appears well-developed and well-nourished. No distress.  HENT:  Head: Normocephalic and atraumatic.  Eyes: Conjunctivae are normal.  Cardiovascular: Normal rate, regular rhythm and normal heart sounds.  Respiratory: Effort normal and breath sounds normal. No respiratory distress. He has no wheezes.  GI: Soft. Bowel sounds are normal. He exhibits no distension. There is no abdominal tenderness.  Musculoskeletal:        General: Tenderness (to palpation of right shoulder ) present.     Comments: Limited rom in right shoulder. Able to externally rotate about 15 degrees.   Skin: He is not diaphoretic. There is erythema (right foot erythema imrproving. 3 bright red spots noted.).   Assessment/Plan:  [name] is a 57 y.o with morbid obesity and has not seen a medical professional for several years presents with right shoulder and right leg pain.   Right leg cellulitis/erysipelas  The patient's right leg erythema, tenderness to palpation, and warmth continues to improve. The patient had a fever to 101.4 yesterday at 12:50am. His leukocytosis is continuing to improve down from 21 to 18.   -Continue clindamycin day 3/5 days, due to extensive skin involvement may need to prolong upto 10-14days. Will monitor closely for signs and symptoms of c dif. -oxycodone 10mg  q4hrs prn   Right  shoulder pain Possibly impingement vs rotator cuff tear. Unable to do mri or ultrasound due to body habitus. CT right shoulder showed no bony abnormalities or large effusions.   -Can change peripheral iv to left arm rather than right arm  Trichomonas  Treated with metronidazole. HIV negative. Pending GC/Chlamydia results. Counseled on safe sex practices.  Dispo: Anticipated discharge in approximately 1-2 day(s).   Ronald Robles, Ronald Rubenstein, MD 11/01/2018, 8:26 AM Pager: [phone]

## 2018-11-02 ENCOUNTER — Encounter (HOSPITAL_COMMUNITY): Payer: Self-pay | Admitting: *Deleted

## 2018-11-02 MED ORDER — CAPSAICIN 0.025 % EX CREA
TOPICAL_CREAM | Freq: Two times a day (BID) | CUTANEOUS | Status: DC
  Administered 2018-11-02 – 2018-11-03 (×3): via TOPICAL
  Filled 2018-11-02: qty 60

## 2018-11-02 MED ORDER — POLYETHYLENE GLYCOL 3350 17 G PO PACK
17.0000 g | PACK | Freq: Every day | ORAL | Status: DC
  Administered 2018-11-02 – 2018-11-03 (×2): 17 g via ORAL
  Filled 2018-11-02 (×2): qty 1

## 2018-11-02 NOTE — Plan of Care (Signed)
  Problem: Education: Goal: Knowledge of General Education information will improve Description Including pain rating scale, medication(s)/side effects and non-pharmacologic comfort measures Outcome: Progressing   

## 2018-11-02 NOTE — Progress Notes (Addendum)
   Subjective:   [name] was seen resting in his bed comfortably this morning. He mentioned that he continues to have severe right shoulder pain. He feels that his right lower extremity pain is improving.   Objective:  Vital signs in last 24 hours: Vitals:   11/01/18 0522 11/01/18 1502 11/01/18 1923 11/02/18 0341  BP: 138/66 (!) 143/84 132/70 136/79  Pulse: 71 76 84 88  Resp: 18  18 18   Temp: 98.5 F (36.9 C) 99 F (37.2 C) 99.6 F (37.6 C) 99.2 F (37.3 C)  TempSrc: Oral Oral Oral Oral  SpO2: 100% 96% 99% 97%  Weight: (!) 185.3 kg   (!) 183.3 kg  Height:       Physical Exam  Constitutional: He is oriented to person, place, and time. He appears well-developed and well-nourished. No distress.  HENT:  Head: Normocephalic and atraumatic.  Eyes: Conjunctivae are normal.  Cardiovascular: Normal rate, regular rhythm and normal heart sounds.  Respiratory: Effort normal and breath sounds normal. No respiratory distress. He has no wheezes.  GI: Soft. Bowel sounds are normal. He exhibits no distension. There is no abdominal tenderness.  Musculoskeletal:        General: Tenderness (right shoulder with minimal palpation) present.     Comments: Limited rom in right shoulder   Neurological: He is alert and oriented to person, place, and time.  Skin: He is not diaphoretic. There is erythema (right le from mid shin down to right foot).  Psychiatric: He has a normal mood and affect. His behavior is normal. Judgment and thought content normal.   Assessment/Plan:  [name] is a 57 y.o with morbid obesity and has not seen a medical professional for several years presents with right shoulder and right leg pain.   Right leg cellulitis/erysipelas  Patient's right lower extremity erythema has improved and will continue clindamycin course.  -Continue clindamycin day 4/10 days, will monitor closely for signs and symptoms of c dif -oxycodone 10mg  q4hrs prn   Right shoulder pain Possibly  impingement vs rotator cuff tear. Physical therapy  -apply heat -capsaicin cream -PT referral  Dispo: Anticipated discharge in approximately 0-1 day.  Ronald Robles, Ronald Takahashi, MD 11/02/2018, 8:15 AM Pager: [phone]

## 2018-11-02 NOTE — Evaluation (Signed)
Physical Therapy Evaluation Patient Details Name: Armandina GemmaGregory Theard MRN: [id number] DOB: [birth year] Today's Date: 11/02/2018   History of Present Illness   Admitted with right shoulder pain and cellulitis of RLE.  PMH significant for obesity.   Clinical Impression  Pt evaluated for right shoulder pain.  Pt is in such acute pain with minimal movement that it makes it difficult to fully assess his shoulder .  Based on a limited evaluation appears he may have rotator cuff involvement and some biceps tendonitis.  Body habitus also makes it difficult to palpate landmarks.  Instructed pt in pendulum exercises, isometric external and internal rotation exercises, working in pain free ROM, ice massage of biceps tendon.  Would an anti-inflammatory mediation possibly help as well?  PT doesn't have much to offer when he is in such acute pain. We will follow up on Tuesday if he is still in the hospital to see if he is getting any pain relief and can tolerate more treatment. May benefit from outpatient PT follow-up.      Follow Up Recommendations Outpatient PT    Equipment Recommendations  None recommended by PT    Recommendations for Other Services       Precautions / Restrictions Precautions Precautions: Fall Required Braces or Orthoses: Sling(for right shoulder for comfort)      Mobility  Bed Mobility Overal bed mobility: Modified Independent                Transfers                 General transfer comment: Not tested  Ambulation/Gait             General Gait Details: not tested  Stairs            Wheelchair Mobility    Modified Rankin (Stroke Patients Only)       Balance                                             Pertinent Vitals/Pain Pain Assessment: 0-10 Pain Score: 10-Worst pain ever Pain Location: r shoulder Pain Descriptors / Indicators: Sharp Pain Intervention(s): Limited activity within patient's tolerance    Home Living  Family/patient expects to be discharged to:: Private residence                      Prior Function                 Hand Dominance   Dominant Hand: Right    Extremity/Trunk Assessment   Upper Extremity Assessment Upper Extremity Assessment: RUE deficits/detail RUE Deficits / Details: Pain and limited ROM and strength in r shoulder    Lower Extremity Assessment Lower Extremity Assessment: (Not assessed)    Cervical / Trunk Assessment Cervical / Trunk Assessment: Kyphotic  Communication   Communication: No difficulties  Cognition Arousal/Alertness: Awake/alert Behavior During Therapy: WFL for tasks assessed/performed Overall Cognitive Status: Within Functional Limits for tasks assessed                                        General Comments      Exercises Shoulder Exercises Pendulum Exercise: (Demonstrated) Shoulder External Rotation: (Instructed in and performed a few isomterics )   Assessment/Plan    PT  Assessment Patient needs continued PT services  PT Problem List Decreased strength;Decreased activity tolerance;Decreased range of motion;Pain;Obesity       PT Treatment Interventions Therapeutic exercise;Patient/family education    PT Goals (Current goals can be found in the Care Plan section)  Acute Rehab PT Goals Patient Stated Goal: to be able to use his arm PT Goal Formulation: With patient Time For Goal Achievement: 11/09/18 Potential to Achieve Goals: Good    Frequency Min 2X/week   Barriers to discharge        Co-evaluation               AM-PAC PT "6 Clicks" Mobility  Outcome Measure Help needed turning from your back to your side while in a flat bed without using bedrails?: A Little Help needed moving from lying on your back to sitting on the side of a flat bed without using bedrails?: A Little Help needed moving to and from a bed to a chair (including a wheelchair)?: None Help needed standing up from a  chair using your arms (e.g., wheelchair or bedside chair)?: None Help needed to walk in hospital room?: None Help needed climbing 3-5 steps with a railing? : A Little 6 Click Score: 21    End of Session   Activity Tolerance: Patient limited by pain Patient left: in bed;with call bell/phone within reach Nurse Communication: Other (comment)(Considering asking MD for anti-inflamatory if clinically app) PT Visit Diagnosis: Pain Pain - Right/Left: Right Pain - part of body: Shoulder    Time: [id number] PT Time Calculation (min) (ACUTE ONLY): 16 min   Charges:   PT Evaluation $PT Eval Low Complexity: 1 Low          Lavona MoundMark Felicitas Sine, PT   Acute Rehabilitation Services  Pager [phone] Office [phone] 11/02/2018   Donnella ShamSawulski, Sangita Zani J 11/02/2018, 3:06 PM

## 2018-11-03 DIAGNOSIS — A46 Erysipelas: Secondary | ICD-10-CM

## 2018-11-03 DIAGNOSIS — Z6841 Body Mass Index (BMI) 40.0 and over, adult: Secondary | ICD-10-CM

## 2018-11-03 DIAGNOSIS — A599 Trichomoniasis, unspecified: Secondary | ICD-10-CM | POA: Diagnosis not present

## 2018-11-03 DIAGNOSIS — L03115 Cellulitis of right lower limb: Secondary | ICD-10-CM | POA: Diagnosis not present

## 2018-11-03 DIAGNOSIS — M25511 Pain in right shoulder: Secondary | ICD-10-CM | POA: Diagnosis not present

## 2018-11-03 LAB — GC/CHLAMYDIA PROBE AMP (~~LOC~~) NOT AT ARMC
CHLAMYDIA, DNA PROBE: NEGATIVE
Neisseria Gonorrhea: NEGATIVE

## 2018-11-03 MED ORDER — CAPSAICIN 0.025 % EX CREA: TOPICAL_CREAM | Freq: Two times a day (BID) | CUTANEOUS | 0 refills | Status: AC

## 2018-11-03 MED ORDER — CLINDAMYCIN HCL 300 MG PO CAPS: 300.0000 mg | ORAL_CAPSULE | Freq: Four times a day (QID) | ORAL | 0 refills | Status: AC

## 2018-11-03 NOTE — Progress Notes (Addendum)
   Subjective: [name] was seen resting in bed comfortably this morning. He reported feeling well this morning. He reported working with PT was helpful and he learned home exercises. He feels his right lower leg is continuing to improve.  Objective:  Vital signs in last 24 hours: Vitals:   11/02/18 0341 11/02/18 1502 11/02/18 2006 11/03/18 0506  BP: 136/79 122/73 119/60 140/65  Pulse: 88 83 82 85  Resp: 18 20 18 16   Temp: 99.2 F (37.3 C) 99.3 F (37.4 C) 99.1 F (37.3 C) 98.6 F (37 C)  TempSrc: Oral Oral Oral Oral  SpO2: 97% 97% 97% 100%  Weight: (!) 183.3 kg     Height:       Physical Exam  Constitutional: He is oriented to person, place, and time and well-developed, well-nourished, and in no distress.  Cardiovascular: Normal rate, regular rhythm and normal heart sounds.  No murmur heard. Pulmonary/Chest: Effort normal and breath sounds normal. No respiratory distress. He has no wheezes.  Abdominal: Soft. Bowel sounds are normal. He exhibits no distension. There is no abdominal tenderness.  Musculoskeletal:        General: Edema present. No tenderness.     Comments: Right LE pitting edema  Neurological: He is alert and oriented to person, place, and time.  Skin: Skin is warm and dry. No erythema.  Psychiatric: Mood, memory, affect and judgment normal.    Assessment/Plan:  Active Problems:   Cellulitis   Cellulitis and abscess of right lower extremity   Pain in joint of right shoulder   Arthralgia of right acromioclavicular joint  [name] is a 57 y.o with morbid obesity and has not seen a medical professional for several years presents with right shoulder and right leg pain.   Right leg cellulitis/erysipelas  Patient's right lower extremity erythema has improved and will continue clindamycin course for a total of 10 days. -Continue clindamycin day 5/10 days, will monitor closely for signs and symptoms of c dif -oxycodone 10mg  q4hrs prn   Right shoulder  pain Possibly impingement vs rotator cuff tear. Physical therapy recommended outpatient PT -apply heat -capsaicin cream -PT referral   Dispo: Patient is stable for discharge today  Jaci StandardRehman, Dimitri Dsouza N, DO 11/03/2018, 8:02 AM Pager: [phone]

## 2018-11-03 NOTE — Discharge Summary (Signed)
Name: Ronald Robles MRN: [id number] DOB: 05-Nov-1961 57 y.o. PCP: System, Pcp Not In  Date of Admission: 10/30/2018  7:25 AM Date of Discharge: 11/03/2018 Attending Physician: Ronald Robles, Ronald B, MD  Discharge Diagnosis: 1. Right leg cellulitis/erysipelas 2. Right shoulder pain 3. Trichomonas  Discharge Medications: Allergies as of 11/03/2018   No Known Allergies     Medication List    STOP taking these medications   cephALEXin 500 MG capsule Commonly known as:  KEFLEX   ondansetron 4 MG disintegrating tablet Commonly known as:  ZOFRAN ODT   traMADol 50 MG tablet Commonly known as:  ULTRAM     TAKE these medications   capsaicin 0.025 % cream Commonly known as:  ZOSTRIX Apply topically 2 (two) times daily.   clindamycin 300 MG capsule Commonly known as:  CLEOCIN Take 1 capsule (300 mg total) by mouth 4 (four) times daily for 5 days.       Disposition and follow-up:   [name] [facility].  At the hospital follow up visit please address:  1.  Right leg cellulitis- reassess patient's right leg pain and swelling; patient was treated with 10 days of clindamycin    2. Right shoulder pain- patient may need outpatient PT and MRI at an imaging center that can accommodate his body habitus  3.  Labs / imaging needed at time of follow-up: none  4.  Pending labs/ test needing follow-up: GC/Chlamdyia results  Follow-up Appointments:   Patient is to schedule a hospital follow up with his PCP in one week.   Hospital Course by problem list: 1. Right leg cellulitis/erysipelas- Patient presented with leg pain and swelling, erythema and warmth, leukocytosis with left shift concerning for bacterial infection. Started on clindamycin. Due to extensive skin involvement it was decided to extend clindamycin treatment up to 10 days. No signs of c diff while he was hospitalized. Discharged with plans to continue  clindamycin for an additional 5 days. 2. Right shoulder pain- Patient presented with right sided shoulder pain. CT shoulder showed no fluid collection and ortho did not deem surgical intervention necessary. Possibly impingement vs rotator cuff tear. Unable to do MRI or Ultrasound due to body habitus; could consider outpatient MRI at an imaging center that could allow this. Physical therapy worked him and recommended outpatient PT. Discharged with capsaicin cream.  3. Trichomonas- UA was + for trichomonas, treated with metronidazole. HIV negative. GC/Chlamydia results pending. Counseled on safe sex practices.   Discharge Vitals:   BP 140/65 (BP Location: Left Arm)   Pulse 85   Temp 98.6 F (37 C) (Oral)   Resp 16   Ht 5\' 11"  (1.803 m)   Wt (!) 183.3 kg   SpO2 100%   BMI 56.36 kg/m   Pertinent Labs, Studies, and Procedures:   CBC Latest Ref Rng & Units 11/01/2018 10/31/2018 10/30/2018  WBC 4.0 - 10.5 K/uL 18.0(H) 21.7(H) 23.2(H)  Hemoglobin 13.0 - 17.0 g/dL 12.0(L) 12.1(L) 13.3  Hematocrit 39.0 - 52.0 % 37.3(L) 36.6(L) 40.2  Platelets 150 - 400 K/uL 189 205 191   Ct Shoulder Right W Contrast  Result Date: 10/30/2018 CLINICAL DATA:  Acute right shoulder pain for the past 2 days with swelling and redness. Leukocytosis. Evaluate for abscess. EXAM: CT OF THE UPPER RIGHT EXTREMITY WITH CONTRAST TECHNIQUE: Multidetector CT imaging of the upper right extremity was performed according to the standard protocol following intravenous contrast administration. COMPARISON:  Right shoulder x-rays from same  day. CONTRAST:  100mL OMNIPAQUE IOHEXOL 300 MG/ML  SOLN FINDINGS: Evaluation is limited due to motion artifact as well as streak artifact related to patient's large body habitus. Bones/Joint/Cartilage No acute fracture or dislocation. No cortical destruction or periosteal reaction. Minimal acromioclavicular osteoarthritis. No large joint effusion. Ligaments Suboptimally assessed by CT. Muscles and  Tendons Unremarkable.  No muscle atrophy. Soft tissues No fluid collection. No soft tissue mass. Visualized right lung is clear. IMPRESSION: 1. Limited study predominantly due to streak artifact related to patient's large body habitus. 2. No abscess. 3.  No acute osseous abnormality.  No large joint effusion. Electronically Signed   By: Obie DredgeWilliam T Robles M.D.   On: 10/30/2018 23:37    Discharge Instructions: Discharge Instructions    Diet - low sodium heart healthy   Complete by:  As directed    Discharge instructions   Complete by:  As directed    [name],  You were hospitalized for a right lower leg skin infection, cellulitis, and a probable right shoulder impingement vs rotator cuff injury.   Please continue to take Clindamycin for an additional five days. I want you to take 300 mg three times a day for a total of five days. You can also continue to use capsaicin cream for your shoulder pain up to two times daily. Please schedule an appointment with your PCP for next week or as soon as possible. They will be able to help coordinate outpatient physical therapy for you.  Also, if you start noticing diffuse diarrhea I want you to see your PCP asap.   Thanks for allowing us to be a part of your care!  Happy Holidays!   Increase activity slowly   Complete by:  As directed       Signed: Jaci StandardRehman, Areeg N, DO 11/03/2018, 8:32 AM   Pager: [phone]

## 2018-11-03 NOTE — Plan of Care (Signed)
  Problem: Skin Integrity: Goal: Skin integrity will improve Outcome: Progressing   

## 2018-11-04 LAB — CULTURE, BLOOD (ROUTINE X 2)
CULTURE: NO GROWTH
CULTURE: NO GROWTH
Special Requests: ADEQUATE
Special Requests: ADEQUATE

## 2018-11-14 ENCOUNTER — Ambulatory Visit (INDEPENDENT_AMBULATORY_CARE_PROVIDER_SITE_OTHER): Payer: Self-pay | Admitting: Internal Medicine

## 2018-11-14 ENCOUNTER — Encounter: Payer: Self-pay | Admitting: Internal Medicine

## 2018-11-14 VITALS — BP 142/96 | HR 82 | Temp 98.1°F | Ht 68.0 in | Wt 385.6 lb

## 2018-11-14 DIAGNOSIS — L02415 Cutaneous abscess of right lower limb: Secondary | ICD-10-CM

## 2018-11-14 DIAGNOSIS — Z6841 Body Mass Index (BMI) 40.0 and over, adult: Secondary | ICD-10-CM

## 2018-11-14 DIAGNOSIS — R030 Elevated blood-pressure reading, without diagnosis of hypertension: Secondary | ICD-10-CM

## 2018-11-14 DIAGNOSIS — M25511 Pain in right shoulder: Secondary | ICD-10-CM

## 2018-11-14 DIAGNOSIS — F329 Major depressive disorder, single episode, unspecified: Secondary | ICD-10-CM

## 2018-11-14 DIAGNOSIS — L03115 Cellulitis of right lower limb: Secondary | ICD-10-CM

## 2018-11-14 MED ORDER — HYDROCODONE-ACETAMINOPHEN 5-325 MG PO TABS: 1.0000 | ORAL_TABLET | Freq: Four times a day (QID) | ORAL | 0 refills | Status: DC | PRN

## 2018-11-14 MED ORDER — CEFTRIAXONE SODIUM 1 G IJ SOLR
1.0000 g | Freq: Once | INTRAMUSCULAR | Status: AC
  Administered 2018-11-14: 1 g via INTRAMUSCULAR

## 2018-11-14 MED ORDER — KETOROLAC TROMETHAMINE 60 MG/2ML IM SOLN
60.0000 mg | Freq: Once | INTRAMUSCULAR | Status: AC
  Administered 2018-11-14: 60 mg via INTRAMUSCULAR

## 2018-11-14 NOTE — Patient Instructions (Signed)
Shoulder Pain Many things can cause shoulder pain, including:  An injury.  Moving the shoulder in the same way again and again (overuse).  Joint pain (arthritis). Pain can come from:  Swelling and irritation (inflammation) of any part of the shoulder.  An injury to the shoulder joint.  An injury to: ? Tissues that connect muscle to bone (tendons). ? Tissues that connect bones to each other (ligaments). ? Bones. Follow these instructions at home: Watch for changes in your symptoms. Let your doctor know about them. Follow these instructions to help with your pain. If you have a sling:  Wear the sling as told by your doctor. Remove it only as told by your doctor.  Loosen the sling if your fingers: ? Tingle. ? Become numb. ? Turn cold and blue.  Keep the sling clean.  If the sling is not waterproof: ? Do not let it get wet. ? Take the sling off when you shower or bathe. Managing pain, stiffness, and swelling   If told, put ice on the painful area: ? Put ice in a plastic bag. ? Place a towel between your skin and the bag. ? Leave the ice on for 20 minutes, 2-3 times a day. Stop putting ice on if it does not help with the pain.  Squeeze a soft ball or a foam pad as much as possible. This prevents swelling in the shoulder. It also helps to strengthen the arm. General instructions  Take over-the-counter and prescription medicines only as told by your doctor.  Keep all follow-up visits as told by your doctor. This is important. Contact a doctor if:  Your pain gets worse.  Medicine does not help your pain.  You have new pain in your arm, hand, or fingers. Get help right away if:  Your arm, hand, or fingers: ? Tingle. ? Are numb. ? Are swollen. ? Are painful. ? Turn white or blue. Summary  Shoulder pain can be caused by many things. These include injury, moving the shoulder in the same away again and again, and joint pain.  Watch for changes in your symptoms.  Let your doctor know about them.  This condition may be treated with a sling, ice, and pain medicine.  Contact your doctor if the pain gets worse or you have new pain. Get help right away if your arm, hand, or fingers tingle or get numb, swollen, or painful.  Keep all follow-up visits as told by your doctor. This is important. This information is not intended to replace advice given to you by your health care provider. Make sure you discuss any questions you have with your health care provider. Document Released: 04/16/2008 Document Revised: 05/13/2018 Document Reviewed: 05/13/2018 Elsevier Interactive Patient Education  2019 [employer]  

## 2018-11-16 ENCOUNTER — Encounter: Payer: Self-pay | Admitting: Internal Medicine

## 2018-11-16 NOTE — Progress Notes (Signed)
Subjective:     Patient ID: Ronald Robles , male    DOB: 1961/08/09 , 58 y.o.   MRN: [id number]   Chief Complaint  Patient presents with  . hospital f/u    HPI  He is here today for hospital follow-up. He was admitted on 12/19 for right leg cellulitis. He was given iv and then oral antibiotics. He was discharged on clindamycin. He states he has taken full course of antibiotics. He was discharged in stable condition on 11/03/18. Hospital course also significant for right shoulder pain. He states they tried to do MRI while in hospital, but he was too big.    Discharge Diagnosis: 1. Right leg cellulitis/erysipelas 2. Right shoulder pain 3. Trichomonas    Past Medical History:  Diagnosis Date  . Cellulitis 10/2018  . Obesity   . Pre-diabetes      Family History  Problem Relation Age of Onset  . Dementia Mother   . Diabetes Father      Current Outpatient Medications:  .  capsaicin (ZOSTRIX) 0.025 % cream, Apply topically 2 (two) times daily., Disp: 60 g, Rfl: 0 .  clindamycin (CLEOCIN) 300 MG capsule, Take 300 mg by mouth 3 (three) times daily., Disp: , Rfl:  .  meloxicam (MOBIC) 15 MG tablet, Take 15 mg by mouth daily., Disp: , Rfl:  .  HYDROcodone-acetaminophen (NORCO/VICODIN) 5-325 MG tablet, Take 1 tablet by mouth every 6 (six) hours as needed for moderate pain., Disp: 30 tablet, Rfl: 0   No Known Allergies   Review of Systems  Constitutional: Negative.   Respiratory: Negative.   Cardiovascular: Negative.   Gastrointestinal: Negative.   Musculoskeletal: Positive for arthralgias.  Neurological: Negative.   Psychiatric/Behavioral: Negative.      Today's Vitals   11/14/18 1437  BP: (!) 142/96  Pulse: 82  Temp: 98.1 F (36.7 C)  TempSrc: Oral  Weight: (!) 385 lb 9.6 oz (174.9 kg)  Height: 5\' 8"  (1.727 m)  PainSc: 8   PainLoc: Shoulder   Body mass index is 58.63 kg/m.   Objective:  Physical Exam Vitals signs and nursing note reviewed.  Constitutional:       Appearance: Normal appearance. He is obese.  HENT:     Head: Normocephalic and atraumatic.  Cardiovascular:     Rate and Rhythm: Normal rate and regular rhythm.     Heart sounds: Normal heart sounds.  Pulmonary:     Effort: Pulmonary effort is normal.     Breath sounds: Normal breath sounds.  Skin:    General: Skin is warm.     Comments: rle swelling, it is warm to touch. Definitely warmer than LLE. There is no overlying erythema.   Neurological:     General: No focal deficit present.     Mental Status: He is alert.         Assessment And Plan:     1. Cellulitis and abscess of right lower extremity  Resolving. He was given rocephin, 1g IM x 1. He is encouraged to call me next week to let me know how he is doing.  DISCHARGE SUMMARY WAS REVIEWED IN FULL DETAIL DURING THE VISIT. MEDS RECONCILED AND COMPARED TO DISCHARGE MEDS. MEDICATION LIST WAS UPDATED AND REVIEWED WITH THE PATIENT. GREATER THAN 50% FACE TO FACE TIME WAS SPENT IN COUNSELING AND COORDINATION OF CARE. ALL QUESTIONS WERE ANSWERED TO THE SATISFACTION OF THE PATIENT.   - cefTRIAXone (ROCEPHIN) injection 1 g  2. Pain in right shoulder  His sx are  suggestive of rotator cuff disease. Unfortunately, he was unable to have MRI while hospitalized. [facility] He was also given rx vicodin to use prn. He reports that tramadol does not help with the pain.   - ketorolac (TORADOL) injection 60 mg  3. Elevated blood pressure reading  He is encouraged to avoid adding salt to his foods. I am sure his should pain is contributing to his elevated blood pressure as well.   4. Class 3 severe obesity due to excess calories with body mass index (BMI) of 50.0 to 59.9 in adult, unspecified whether serious comorbidity present [facility](HCC)  He is encouraged to strive for BMI less than 45 to decrease cardiac risk. He is not interested in surgical approach at this  time. He is encouraged to avoid sugary beverages, increase activity as tolerated and to avoid processed foods including deli meats, etc.   5. Depressive disorder  Pt is unfortunately going through a bitter divorce. He is not able to live in his own home. He is now living with his daughter. He is willing to consider counseling. He reports he will feel better once this is over.   Gwynneth Alimentobyn N Mosi Hannold, MD

## 2018-11-17 ENCOUNTER — Telehealth: Payer: Self-pay

## 2018-11-17 ENCOUNTER — Other Ambulatory Visit: Payer: Self-pay

## 2018-11-17 MED ORDER — HYDROCODONE-ACETAMINOPHEN 5-325 MG PO TABS: 1.0000 | ORAL_TABLET | Freq: Four times a day (QID) | ORAL | 0 refills | Status: AC | PRN

## 2018-11-17 NOTE — Telephone Encounter (Signed)
Pt notified that his prescription for hydrocodone-acetaminophen is ready for pickup.

## 2018-12-10 ENCOUNTER — Ambulatory Visit: Payer: Self-pay | Admitting: Internal Medicine

## 2019-10-30 ENCOUNTER — Other Ambulatory Visit: Payer: Self-pay

## 2019-10-30 DIAGNOSIS — Z20822 Contact with and (suspected) exposure to covid-19: Secondary | ICD-10-CM

## 2019-10-31 LAB — NOVEL CORONAVIRUS, NAA: SARS-CoV-2, NAA: NOT DETECTED

## 2019-11-28 ENCOUNTER — Other Ambulatory Visit: Payer: Self-pay

## 2019-11-28 DIAGNOSIS — Z20822 Contact with and (suspected) exposure to covid-19: Secondary | ICD-10-CM

## 2019-11-29 LAB — NOVEL CORONAVIRUS, NAA: SARS-CoV-2, NAA: NOT DETECTED

## 2020-04-26 IMAGING — CT CT SHOULDER*R* W/CM
3 of 4 series · 15 of 33 positions shown, 17 images · IV contrast (agent unspecified)
Comparison: Right shoulder x-rays from same day.

CONTRAST:  100mL OMNIPAQUE IOHEXOL 300 MG/ML  SOLN

CLINICAL DATA: Acute right shoulder pain for the past 2 days with
swelling and redness. Leukocytosis. Evaluate for abscess.

EXAM:
CT OF THE UPPER RIGHT EXTREMITY WITH CONTRAST
TECHNIQUE: Multidetector CT imaging of the upper right extremity was performed
according to the standard protocol following intravenous contrast
administration.

[Series 3: shoulder 3.0 b31s st · axial · 0.52mm/px · z∈[-591,-396]mm · 7 of 77 slices shown, 9 images]
[im 6/77  soft-tissue]
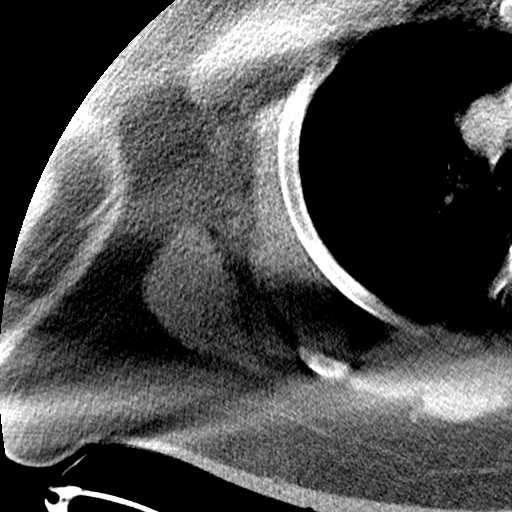
[im 6/77  bone]
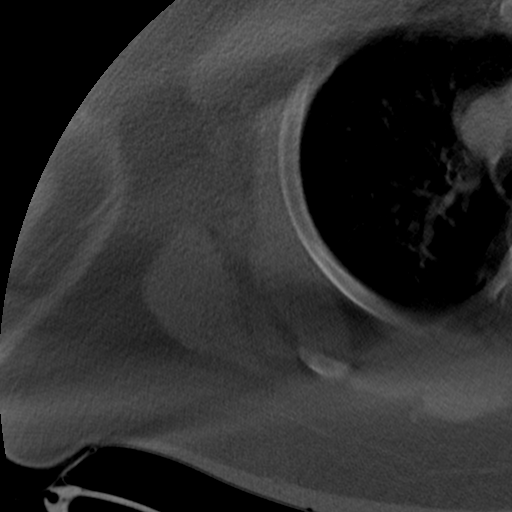
[im 18/77  bone]
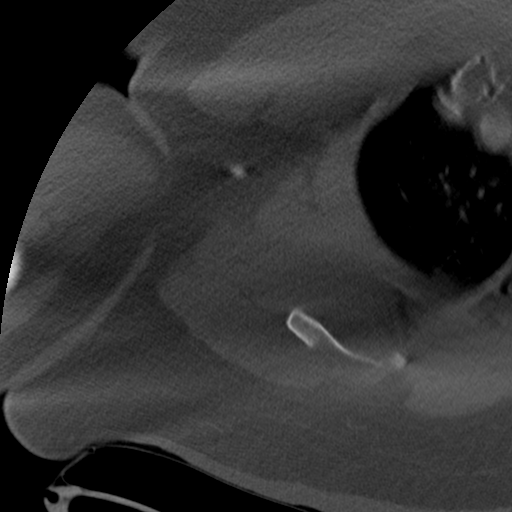
[im 30/77  bone]
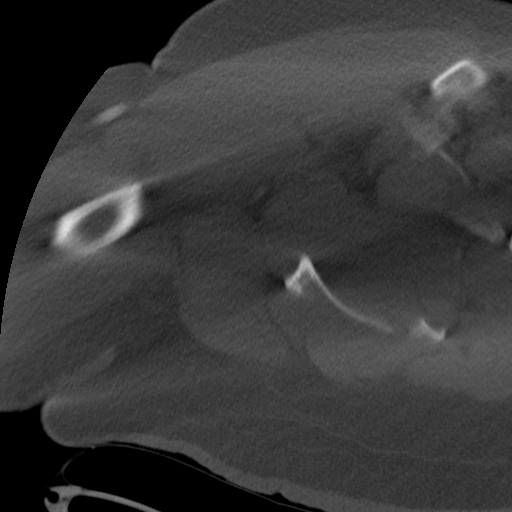
[im 41/77  bone]
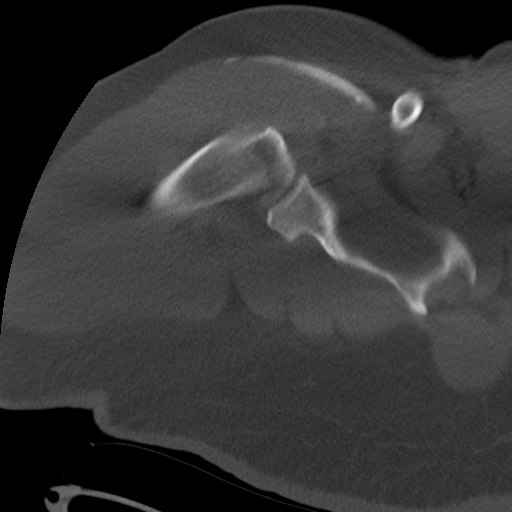
[im 47/77  soft-tissue]
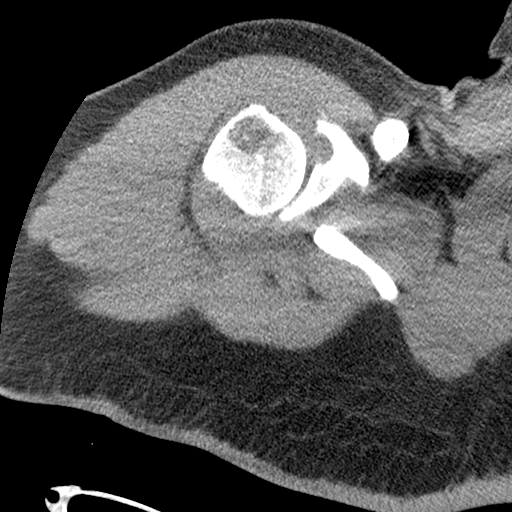
[im 47/77  bone]
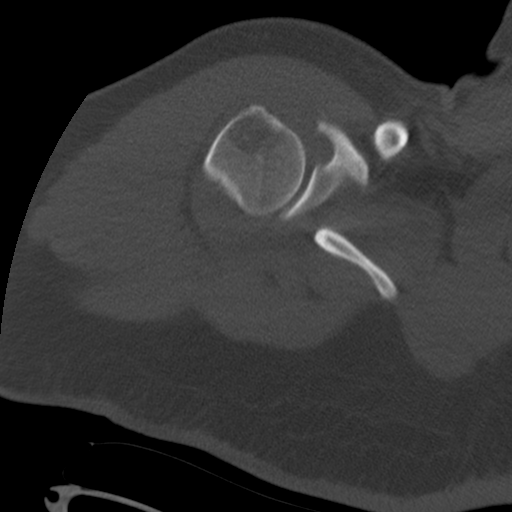
[im 59/77  bone]
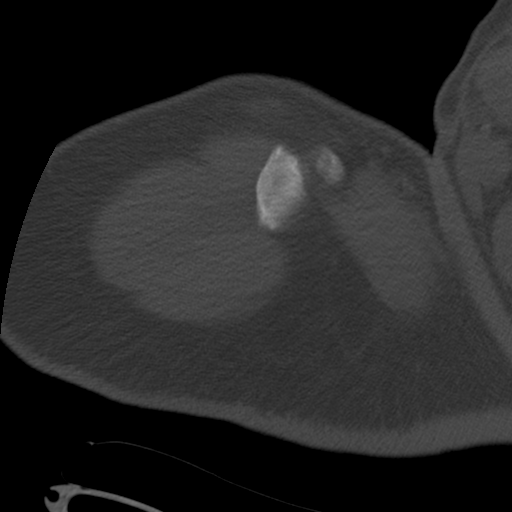
[im 71/77  bone]
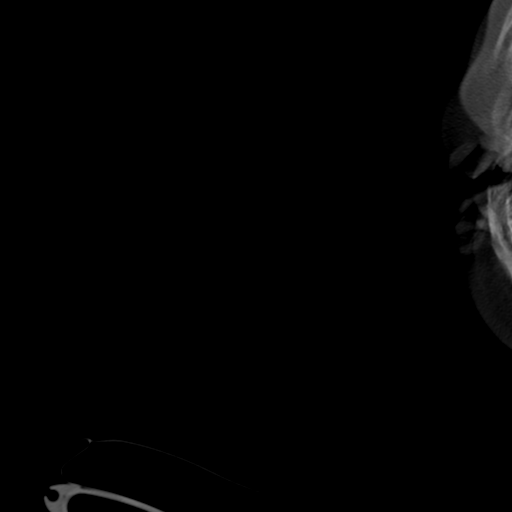

[Series 9: cor st · coronal · 0.45mm/px · 3 of 142 slices shown]
[im 29/142  bone]
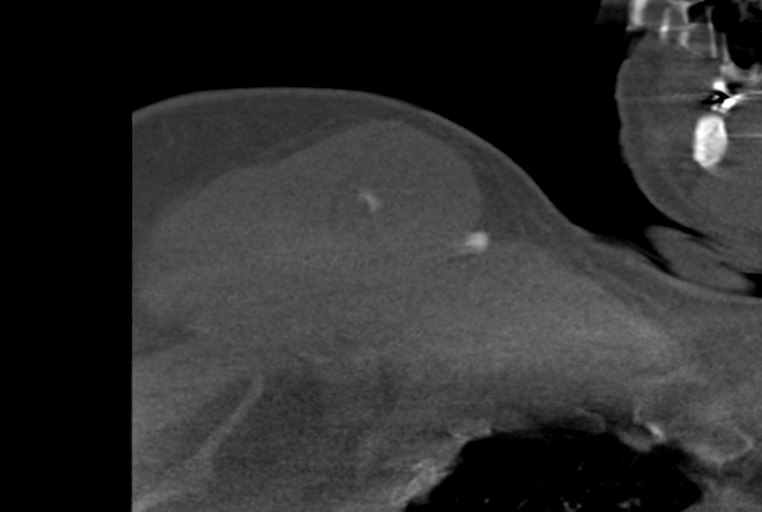
[im 57/142  bone]
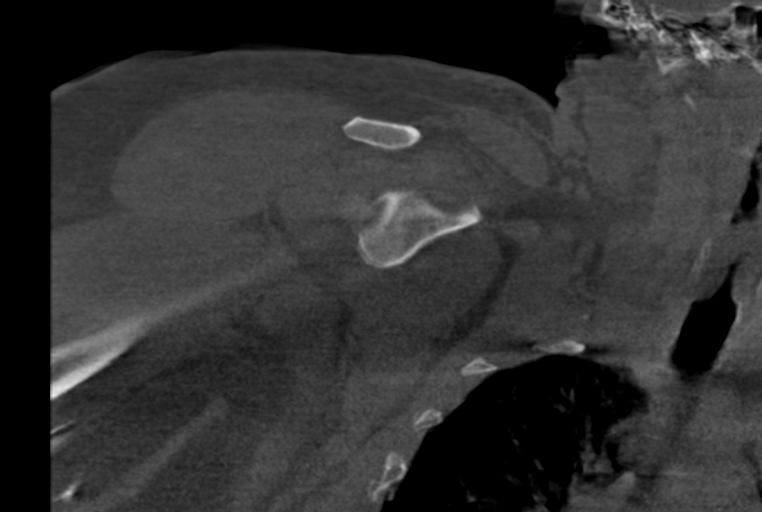
[im 85/142  bone]
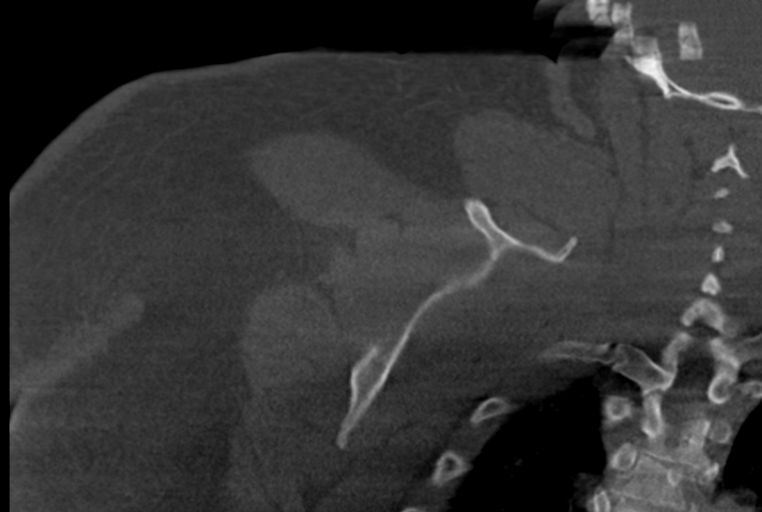

[Series 10: sag st · sagittal · 0.56mm/px · 5 of 154 slices shown]
[im 26/154  bone]
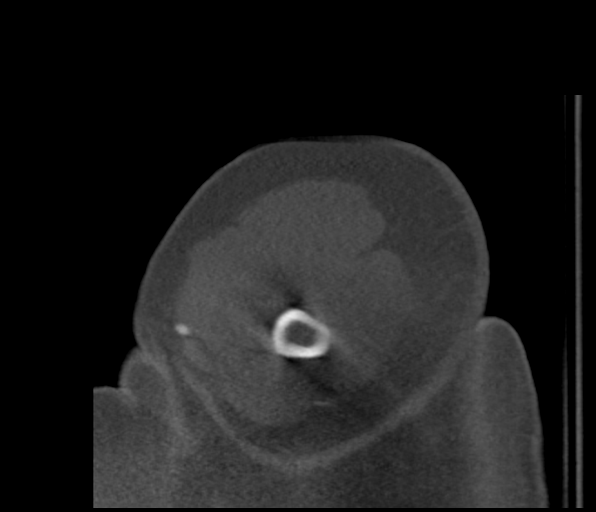
[im 52/154  bone]
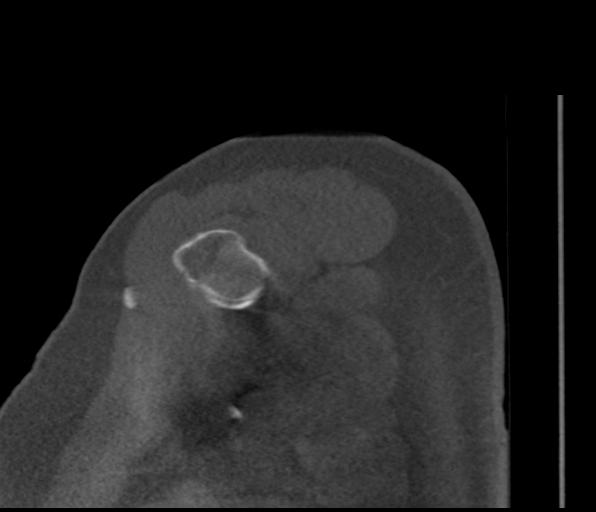
[im 77/154  bone]
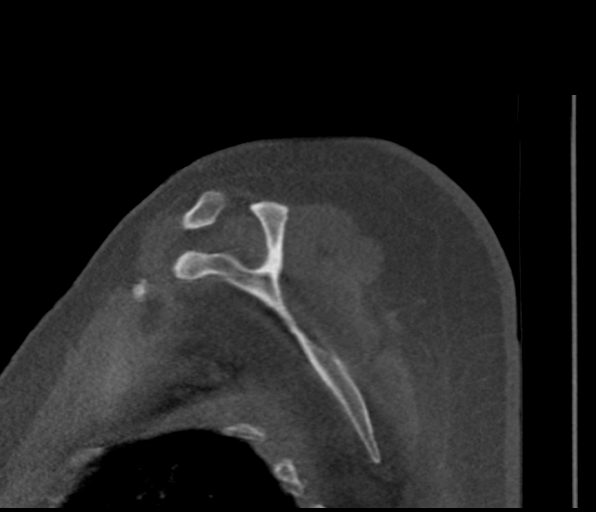
[im 103/154  bone]
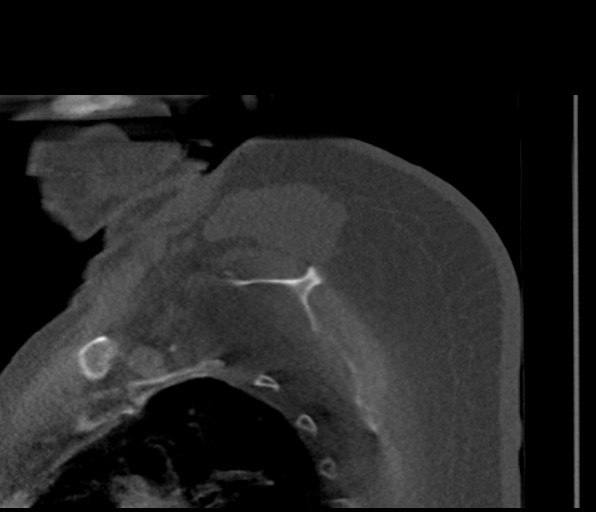
[im 128/154  bone]
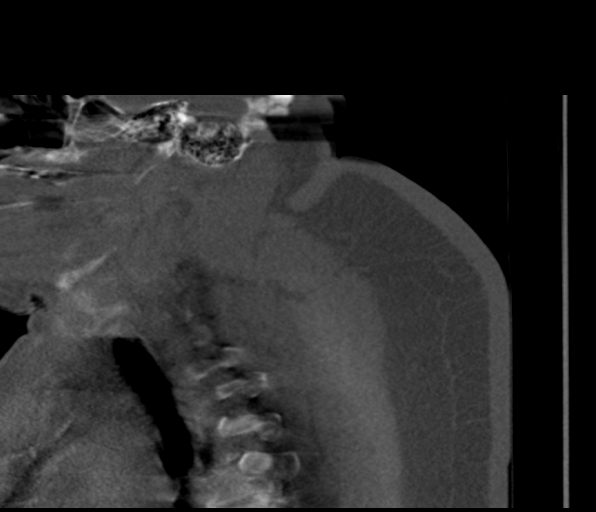

[15 of 33 positions shown; findings below may reference images not displayed]

FINDINGS: Evaluation is limited due to motion artifact as well as streak
artifact related to patient's large body habitus.

Bones/Joint/Cartilage

No acute fracture or dislocation. No cortical destruction or
periosteal reaction. Minimal acromioclavicular osteoarthritis. No
large joint effusion.

Ligaments

Suboptimally assessed by CT.

Muscles and Tendons

Unremarkable.  No muscle atrophy.

Soft tissues

No fluid collection. No soft tissue mass. Visualized right lung is
clear.
IMPRESSION: 1. Limited study predominantly due to streak artifact related to
patient's large body habitus.
2. No abscess.
3.  No acute osseous abnormality.  No large joint effusion.

## 2024-05-29 ENCOUNTER — Encounter: Payer: Self-pay | Admitting: Advanced Practice Midwife

## 2024-12-10 ENCOUNTER — Other Ambulatory Visit (HOSPITAL_COMMUNITY): Payer: Self-pay

## 2024-12-10 ENCOUNTER — Telehealth: Payer: Self-pay | Admitting: Nurse Practitioner

## 2024-12-10 DIAGNOSIS — J40 Bronchitis, not specified as acute or chronic: Secondary | ICD-10-CM

## 2024-12-10 MED ORDER — AZITHROMYCIN 250 MG PO TABS
ORAL_TABLET | ORAL | 0 refills | Status: AC
  Filled 2024-12-10: qty 6, 5d supply, fill #0

## 2024-12-10 MED ORDER — AZITHROMYCIN 250 MG PO TABS: ORAL_TABLET | ORAL | 0 refills | Status: DC

## 2024-12-10 MED ORDER — PROMETHAZINE-DM 6.25-15 MG/5ML PO SYRP: 5.0000 mL | ORAL_SOLUTION | Freq: Every evening | ORAL | 0 refills | Status: DC | PRN

## 2024-12-10 MED ORDER — PROMETHAZINE-DM 6.25-15 MG/5ML PO SYRP
5.0000 mL | ORAL_SOLUTION | Freq: Every evening | ORAL | 1 refills | Status: AC | PRN
  Filled 2024-12-10: qty 118, 23d supply, fill #0

## 2024-12-10 MED ORDER — BENZONATATE 100 MG PO CAPS
100.0000 mg | ORAL_CAPSULE | Freq: Three times a day (TID) | ORAL | 0 refills | Status: AC | PRN
  Filled 2024-12-10: qty 30, 10d supply, fill #0

## 2024-12-10 MED ORDER — BENZONATATE 100 MG PO CAPS: 100.0000 mg | ORAL_CAPSULE | Freq: Three times a day (TID) | ORAL | 0 refills | Status: DC | PRN

## 2024-12-10 NOTE — Progress Notes (Signed)
 "  Acute Video Visit    Virtual Visit Consent:   Ronald Robles, you are scheduled for a virtual visit with a [facility] provider today.     Just as with appointments in the office, your consent must be obtained to participate.  Your consent will be active for this visit and any virtual visit you may have with one of our providers in the next 365 days.     If you have a MyChart account, a copy of this consent can be sent to you electronically.  All virtual visits are billed to your insurance company just like a traditional visit in the office.    If the connection with a video visit is poor, the visit may have to be switched to a telephone visit.  With either a video or telephone visit, we are not always able to ensure that we have a secure connection.     I need to obtain your verbal consent now.   Are you willing to proceed with your visit today?    Ronald Robles has provided verbal consent on 12/10/2024 for a virtual visit (video or telephone).   Lauraine Kitty, FNP  Date: 12/10/2024 11:01 AM  Subjective:     Patient ID: Ronald Robles, male    DOB: 04-20-1961, 64 y.o.   MRN: [id number]  [name] Lauraine Kitty, connected with  Jermond Burkemper  ([id number], 06/20/1961) on 12/10/24 at 11:00 AM EST by a audio only, attempted video- patient unable to connect so continued visit with audio only application and verified that I am speaking with the correct person using two identifiers.   Location: Patient: Home  Provider: Virtual Visit Location Provider: Home Office   I discussed the limitations of evaluation and management by telemedicine and the availability of in person appointments. The patient expressed understanding and agreed to proceed.     HPI Ronald Robles is a 64 y.o. who identifies as a male who was assigned male at birth, and is being seen today for an acute visit.   He started to feel sick last week. Started with sore throat and congestion, has progressed to his chest. Now has a  productive cough. Sinus congestion has improved. Has an ongoing sore throat.   Denies a history of asthma or need for inhalers  He has been using Theraflu for relief   Not currently taking any daily medications   Review of Systems  Constitutional:  Positive for malaise/fatigue.  HENT:  Positive for sore throat.   Eyes: Negative.   Respiratory:  Positive for cough.   Cardiovascular: Negative.   Genitourinary: Negative.   Musculoskeletal:  Positive for myalgias.  Neurological:  Positive for headaches. Negative for dizziness.       Objective:      Physical Exam Constitutional:      General: He is not in acute distress. Pulmonary:     Effort: Pulmonary effort is normal.  Neurological:     Mental Status: He is alert and oriented to person, place, and time.  Psychiatric:        Mood and Affect: Mood normal.        Assessment & Plan:   1. Bronchitis (Primary)  - azithromycin  (ZITHROMAX ) 250 MG tablet; Take 2 tablets on day 1, then 1 tablet daily on days 2 through 5  Dispense: 6 tablet; Refill: 0 - benzonatate  (TESSALON ) 100 MG capsule; Take 1 capsule (100 mg total) by mouth 3 (three) times daily as needed.  Dispense: 30 capsule; Refill: 0 -  promethazine -dextromethorphan (PROMETHAZINE -DM) 6.25-15 MG/5ML syrup; Take 5 mLs by mouth at bedtime as needed for cough. Will cause drowsiness  Dispense: 118 mL; Refill: 0   Follow Up Instructions: I discussed the assessment and treatment plan with the patient. The patient was provided an opportunity to ask questions and all were answered. The patient agreed with the plan and demonstrated an understanding of the instructions.  A copy of instructions were sent to the patient via MyChart unless otherwise noted below.    The patient was advised to call back or seek an in-person evaluation if the symptoms worsen or if the condition fails to improve as anticipated.    Lauraine Kitty, FNP  **Disclaimer: This note may have been dictated with  voice recognition software. Similar sounding words can inadvertently be transcribed and this note may contain transcription errors which may not have been corrected upon publication of note.** "

## 2024-12-10 NOTE — Addendum Note (Signed)
 Addended by: KENNYTH DOMINO E on: 12/10/2024 12:10 PM   Modules accepted: Orders
